# Patient Record
Sex: Male | Born: 1990 | Race: White | Hispanic: No | Marital: Married | Smoking: Never smoker
Health system: Southern US, Community
[De-identification: ages and names within clinical notes are randomized; demographics above are authoritative.]

## PROBLEM LIST (undated history)

## (undated) DIAGNOSIS — I10 Essential (primary) hypertension: Secondary | ICD-10-CM

## (undated) DIAGNOSIS — E079 Disorder of thyroid, unspecified: Secondary | ICD-10-CM

## (undated) HISTORY — DX: Disorder of thyroid, unspecified: E07.9

## (undated) HISTORY — DX: Essential (primary) hypertension: I10

## (undated) HISTORY — PX: CYST REMOVAL HAND: SHX6279

---

## 2014-04-05 ENCOUNTER — Ambulatory Visit: Payer: Self-pay

## 2014-04-05 LAB — DOT URINE DIP
BLOOD: NEGATIVE
Glucose,UR: NEGATIVE mg/dL (ref 0–75)
Specific Gravity: 1.01 (ref 1.003–1.030)

## 2014-07-19 ENCOUNTER — Ambulatory Visit: Payer: Self-pay | Admitting: Physician Assistant

## 2014-07-19 LAB — TSH: Thyroid Stimulating Horm: 3.2 u[IU]/mL

## 2014-07-19 LAB — CBC WITH DIFFERENTIAL/PLATELET
BASOS PCT: 0.6 %
Basophil #: 0 10*3/uL (ref 0.0–0.1)
EOS ABS: 0.1 10*3/uL (ref 0.0–0.7)
EOS PCT: 1.9 %
HCT: 44.5 % (ref 40.0–52.0)
HGB: 14.6 g/dL (ref 13.0–18.0)
Lymphocyte #: 2 10*3/uL (ref 1.0–3.6)
Lymphocyte %: 27 %
MCH: 29.1 pg (ref 26.0–34.0)
MCHC: 32.9 g/dL (ref 32.0–36.0)
MCV: 89 fL (ref 80–100)
MONOS PCT: 7 %
Monocyte #: 0.5 x10 3/mm (ref 0.2–1.0)
NEUTROS ABS: 4.6 10*3/uL (ref 1.4–6.5)
Neutrophil %: 63.5 %
Platelet: 229 10*3/uL (ref 150–440)
RBC: 5.03 10*6/uL (ref 4.40–5.90)
RDW: 12.7 % (ref 11.5–14.5)
WBC: 7.3 10*3/uL (ref 3.8–10.6)

## 2014-09-16 ENCOUNTER — Ambulatory Visit: Payer: Self-pay | Admitting: Physician Assistant

## 2014-10-08 ENCOUNTER — Ambulatory Visit: Payer: Self-pay | Admitting: Family Medicine

## 2016-04-06 DIAGNOSIS — G44229 Chronic tension-type headache, not intractable: Secondary | ICD-10-CM | POA: Insufficient documentation

## 2016-04-06 DIAGNOSIS — E7800 Pure hypercholesterolemia, unspecified: Secondary | ICD-10-CM | POA: Insufficient documentation

## 2017-10-07 DIAGNOSIS — E559 Vitamin D deficiency, unspecified: Secondary | ICD-10-CM | POA: Insufficient documentation

## 2018-10-27 ENCOUNTER — Ambulatory Visit (INDEPENDENT_AMBULATORY_CARE_PROVIDER_SITE_OTHER): Payer: Managed Care, Other (non HMO)

## 2018-10-27 ENCOUNTER — Ambulatory Visit: Payer: Managed Care, Other (non HMO) | Admitting: Physician Assistant

## 2018-10-27 ENCOUNTER — Encounter: Payer: Self-pay | Admitting: Physician Assistant

## 2018-10-27 VITALS — BP 122/82 | HR 65 | Temp 98.3°F | Ht 65.0 in | Wt 230.5 lb

## 2018-10-27 DIAGNOSIS — Z8639 Personal history of other endocrine, nutritional and metabolic disease: Secondary | ICD-10-CM

## 2018-10-27 DIAGNOSIS — I10 Essential (primary) hypertension: Secondary | ICD-10-CM | POA: Diagnosis not present

## 2018-10-27 DIAGNOSIS — Z131 Encounter for screening for diabetes mellitus: Secondary | ICD-10-CM

## 2018-10-27 DIAGNOSIS — Z130 Encounter for screening for diseases of the blood and blood-forming organs and certain disorders involving the immune mechanism: Secondary | ICD-10-CM

## 2018-10-27 DIAGNOSIS — F40243 Fear of flying: Secondary | ICD-10-CM

## 2018-10-27 DIAGNOSIS — Z7689 Persons encountering health services in other specified circumstances: Secondary | ICD-10-CM | POA: Diagnosis not present

## 2018-10-27 DIAGNOSIS — M25511 Pain in right shoulder: Secondary | ICD-10-CM | POA: Insufficient documentation

## 2018-10-27 DIAGNOSIS — M7521 Bicipital tendinitis, right shoulder: Secondary | ICD-10-CM

## 2018-10-27 DIAGNOSIS — Z13220 Encounter for screening for lipoid disorders: Secondary | ICD-10-CM

## 2018-10-27 MED ORDER — ALPRAZOLAM 0.5 MG PO TABS: 0.2500 mg | ORAL_TABLET | Freq: Once | ORAL | 0 refills | Status: DC | PRN

## 2018-10-27 MED ORDER — MELOXICAM 15 MG PO TABS: ORAL_TABLET | ORAL | 3 refills | Status: DC

## 2018-10-27 NOTE — Progress Notes (Signed)
HPI:                                                                Richard Long is a 27 y.o. male who presents to [facility]: Primary Care Sports Medicine today to establish care  Current concerns:   HTN: used to take Metoprolol, but self-discontinued this due to running out of refills. Wife reports BP at home is always in range, SBP 120's, DBP <80. 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 He recently lost 40 pounds this year. Denies vision change, headache, chest pain with exertion, orthopnea, lightheadedness, syncope and edema. Risk factors include:   Requesting medication for upcoming flight to the [place] this Friday. 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  R shoulder pain - acute on chronic for 2 weeks. 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 About 2 weeks ago while at work he "subluxed a guy" and reports landing on his R elbow, forcing his right shoulder up. States he now has constant pain that wakes him from sleep and difficulty with "lateral" movements. States he is "slower" drawing his gun and has pain with drawing. He has been taking Ibuprofen and Tylenol with minimal relief.   Depression screen PHQ 2/9 10/27/2018  Decreased Interest 0  Down, Depressed, Hopeless 0  PHQ - 2 Score 0    GAD 7 : Generalized Anxiety Score 10/27/2018  Nervous, Anxious, on Edge 2  Control/stop worrying 0  Worry too much - different things 2  Trouble relaxing 2  Restless 3  Easily annoyed or irritable 1  Afraid - awful might happen 3  Total GAD 7 Score 13  Anxiety Difficulty Not difficult at all      Past Medical History:  Diagnosis Date  . Hypertension   . Thyroid disease    Past Surgical History:  Procedure Laterality Date  . CYST REMOVAL HAND     Social History   Tobacco Use  . Smoking status: Never Smoker  . Smokeless tobacco: Never Used  Substance Use Topics  . Alcohol use: Yes   Comment: social   family history includes Diabetes in his father, maternal grandfather, and maternal grandmother; Heart attack in his maternal grandfather and paternal grandfather; Heart disease in his paternal grandfather; Hyperlipidemia in his mother.    ROS: Review of Systems  Gastrointestinal: Positive for constipation and diarrhea.  Psychiatric/Behavioral: Negative for depression. The patient is nervous/anxious.      Medications: Current Outpatient Medications  Medication Sig Dispense Refill  . Multiple Vitamin (MULTIVITAMIN) tablet Take 1 tablet by mouth daily.    Marland Kitchen ALPRAZolam (XANAX) 0.5 MG tablet Take 0.5-2 tablets (0.25-1 mg total) by mouth once as needed for up to 1 dose for anxiety. Take 1 hour prior to flight 10 tablet 0  . meloxicam (MOBIC) 15 MG tablet One tab PO qAM with breakfast for 2 weeks, then daily prn pain. 30 tablet 3   No current facility-administered medications for this visit.    Allergies  Allergen Reactions  . Shellfish Allergy Itching  . Tape Rash    Burn       Objective:  BP 122/82   Pulse 65   Temp 98.3  F (36.8 C) (Oral)   Ht 5\' 5"  (1.651 m)   Wt 230 lb 8 oz (104.6 kg)   BMI 38.36 kg/m  Gen:  alert, not ill-appearing, no distress, appropriate for age, obese male HEENT: head normocephalic without obvious abnormality, conjunctiva and cornea clear, trachea midline Pulm: Normal work of breathing, normal phonation, clear to auscultation bilaterally, no wheezes, rales or rhonchi CV: Normal rate, regular rhythm, s1 and s2 distinct, no murmurs, clicks or rubs  Neuro: alert and oriented x 3, no tremor MSK: extremities atraumatic, normal gait and station Skin: intact, no rashes on exposed skin, no jaundice, no cyanosis Psych: well-groomed, cooperative, good eye contact, euthymic mood, affect mood-congruent, speech is articulate, and thought processes clear and goal-directed    No results found for this or any previous visit (from the past 72  hour(s)). No results found.    Assessment and Plan: 27 y.o. male with   .Fraser Dinreston was seen today for establish care.  Diagnoses and all orders for this visit:  Encounter to establish care  History of thyroid disorder -     Thyroid Panel With TSH  Hypertension goal BP (blood pressure) < 130/80 -     COMPLETE METABOLIC PANEL WITH GFR -     Thyroid Panel With TSH  Screening for blood disease -     COMPLETE METABOLIC PANEL WITH GFR -     CBC  Screening for lipid disorders -     Lipid Panel w/reflex Direct LDL  Fear of flying -     ALPRAZolam (XANAX) 0.5 MG tablet; Take 0.5-2 tablets (0.25-1 mg total) by mouth once as needed for up to 1 dose for anxiety. Take 1 hour prior to flight  Screening for diabetes mellitus -     Hemoglobin A1c  Biceps tendinitis, right -     DG Shoulder Right; Future -     meloxicam (MOBIC) 15 MG tablet; One tab PO qAM with breakfast for 2 weeks, then daily prn pain. -     Ambulatory referral to Physical Therapy  Class 2 severe obesity due to excess calories with serious comorbidity in adult, unspecified BMI (HCC)   - Personally reviewed PMH, PSH, PFH, medications, allergies, HM - Age-appropriate cancer screening: n/a - Influenza declined - Tdap UTD - PHQ2 negative  Hypertension: BP in range in office today. He has lost 40 pounds in 1 year. Continue to manage with therapeutic lifestyle changes. Fasting labs pending  R shoulder pain: due to nature of patient's occupation and safety concerns, Sports Medicine was consulted today for R shoulder pain (see A&P note)   Patient education and anticipatory guidance given Patient agrees with treatment plan Follow-up in 6 months for HTN or sooner as needed if symptoms worsen or fail to improve  Levonne Hubertharley E. Wylodean Shimmel PA-C

## 2018-10-27 NOTE — Assessment & Plan Note (Signed)
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 Return to see me in 4 weeks, we will discuss Nitropatch versus biceps tendon sheath injection if no better.

## 2018-10-27 NOTE — Patient Instructions (Addendum)
For your blood pressure: - Goal <130/80 (Ideally 120's/70's) - take your blood pressure medication in the morning (unless instructed differently) - monitor and log blood pressures at home - check around the same time each day in a relaxed setting - Limit salt to <2500 mg/day - Follow DASH (Dietary Approach to Stopping Hypertension) eating plan - Try to get at least 150 minutes of aerobic exercise per week - Aim to go on a brisk walk 30 minutes per day at least 5 days per week. If you're not active, gradually increase how long you walk by 5 minutes each week - limit alcohol: 2 standard drinks per day for men and 1 per day for women - avoid tobacco/nicotine products. Consider smoking cessation if you smoke - weight loss: 7% of current body weight can reduce your blood pressure by 5-10 points - follow-up at least every 6 months for your blood pressure. Follow-up sooner if your BP is not controlled   

## 2018-10-27 NOTE — Progress Notes (Addendum)
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  Afterwards he had pain anteriorly, worse with flexion, abduction.  Occasional mechanical symptoms, moderate, persistent, localized without radiation.  I reviewed the past medical history, family history, social history, surgical history, and allergies today and no changes were needed.  Please see the problem list section below in epic for further details.  Past Medical History: Past Medical History:  Diagnosis Date  . Hypertension   . Thyroid disease    Past Surgical History: Past Surgical History:  Procedure Laterality Date  . CYST REMOVAL HAND     Social History: Social History   Socioeconomic History  . Marital status: Married    Spouse name: Not on file  . Number of children: Not on file  . Years of education: Not on file  . Highest education level: Not on file  Occupational History  . Occupation: Emergency planning/management officer  Social Needs  . Financial resource strain: Not on file  . Food insecurity:    Worry: Not on file    Inability: Not on file  . Transportation needs:    Medical: Not on file    Non-medical: Not on file  Tobacco Use  . Smoking status: Never Smoker  . Smokeless tobacco: Never Used  Substance and Sexual Activity  . Alcohol use: Yes    Comment: social  . Drug use: Never  . Sexual activity: Yes    Birth control/protection: None  Lifestyle  . Physical activity:    Days per week: Not on file    Minutes per session: Not on file  . Stress: Not on file  Relationships  . Social connections:    Talks on phone: Not on file    Gets together: Not on file    Attends religious service: Not on file    Active member of club or organization: Not on file    Attends meetings of clubs or organizations: Not on file    Relationship  status: Not on file  Other Topics Concern  . Not on file  Social History Narrative  . Not on file   Family History: Family History  Problem Relation Age of Onset  . Hyperlipidemia Mother   . Diabetes Father   . Diabetes Maternal Grandmother   . Heart attack Maternal Grandfather   . Diabetes Maternal Grandfather   . Heart attack Paternal Grandfather   . Heart disease Paternal Grandfather    Allergies: Allergies  Allergen Reactions  . Shellfish Allergy Itching  . Tape Rash    Burn   Medications: See med rec.  Review of Systems: No fevers, chills, night sweats, weight loss, chest pain, or shortness of breath.   Objective:    General: Well Developed, well nourished, and in no acute distress.  Neuro: Alert and oriented x3, extra-ocular muscles intact, sensation grossly intact.  HEENT: Normocephalic, atraumatic, pupils equal round reactive to light, neck supple, no masses, no lymphadenopathy, thyroid nonpalpable.  Skin: Warm and dry, no rashes. Cardiac: Regular rate and rhythm, no murmurs rubs or gallops, no lower extremity edema.  Respiratory: Clear to auscultation bilaterally. Not using accessory muscles, speaking in full sentences. Right shoulder: Inspection reveals no abnormalities, atrophy or asymmetry. Palpation is normal with no tenderness over AC joint or bicipital groove. ROM is full in all planes.  Rotator cuff strength normal throughout. No signs of impingement with negative Neer and Hawkin's tests, empty can. Positive speeds test, negative Yergason test. No labral pathology noted with negative Obrien's, negative crank, negative clunk, and good stability. Normal scapular function observed. No painful arc and no drop arm sign. 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 Return to see me in 4 weeks, we will discuss Nitropatch versus biceps tendon sheath  injection if no better. ___________________________________________ Ihor Austinhomas J. Benjamin Stainhekkekandam, M.D., ABFM., CAQSM. Primary Care and Sports Medicine [facility]  Adjunct Professor of Family Medicine  [employer]

## 2018-10-28 ENCOUNTER — Encounter: Payer: Self-pay | Admitting: *Deleted

## 2018-11-17 ENCOUNTER — Other Ambulatory Visit: Payer: Self-pay

## 2018-11-17 ENCOUNTER — Encounter: Payer: Self-pay | Admitting: Physical Therapy

## 2018-11-17 ENCOUNTER — Ambulatory Visit: Payer: Managed Care, Other (non HMO) | Admitting: Physical Therapy

## 2018-11-17 DIAGNOSIS — R293 Abnormal posture: Secondary | ICD-10-CM | POA: Diagnosis not present

## 2018-11-17 DIAGNOSIS — M25521 Pain in right elbow: Secondary | ICD-10-CM | POA: Diagnosis not present

## 2018-11-17 DIAGNOSIS — M25511 Pain in right shoulder: Secondary | ICD-10-CM | POA: Diagnosis not present

## 2018-11-17 DIAGNOSIS — R29898 Other symptoms and signs involving the musculoskeletal system: Secondary | ICD-10-CM | POA: Diagnosis not present

## 2018-11-17 NOTE — Patient Instructions (Signed)
Access Code: [email or web address]  URL: [email or web address]  Date: 11/17/2018  Prepared by: Moshe CiproStephanie Rayansh Herbst   Exercises  Doorway Pec Stretch at 90 Degrees Abduction - 3 reps - 1 sets - 30 sec hold - 1x daily - 7x weekly  Scapular Retraction with Resistance - 10 reps - 1 sets - 5 sec hold - 1x daily - 7x weekly  Prone W Scapular Retraction - 10 reps - 1 sets - 5sec hold - 1x daily - 7x weekly

## 2018-11-17 NOTE — Therapy (Signed)
[facility] [address] [place], [place], [address] Phone: [phone]   Fax:  [phone]  Physical Therapy Evaluation  Patient Details  Name: Richard Long MRN: [id number] Date of Birth: 22-Sep-1991 Referring Provider (PT): Monica Becton, MD   Encounter Date: 11/17/2018  PT End of Session - 11/17/18 1029    Visit Number  1    Number of Visits  12    Date for PT Re-Evaluation  12/29/18    PT Start Time  0931    PT Stop Time  1005    PT Time Calculation (min)  34 min    Activity Tolerance  Patient tolerated treatment well    Behavior During Therapy  [facility] for tasks assessed/performed       Past Medical History:  Diagnosis Date  . Hypertension   . Thyroid disease     Past Surgical History:  Procedure Laterality Date  . CYST REMOVAL HAND      There were no vitals filed for this visit.   Subjective Assessment - 11/17/18 0933    Subjective  Pt is a 27 y/o male who presnts to OPPT for Rt shoulder and elbow pain radiating into Rt hand.  Pt repots initial injury occured ~ 2 months ago.  Pt c/o tingling in forearm, and pain with sleeping on Rt side.      Pertinent History  HTN    Patient Stated Goals  improve shoulder pain and be able to return to regular work activity    Currently in Pain?  Yes    Pain Score  0-No pain   up to 5/10   Pain Location  Shoulder    Pain Orientation  Right    Pain Descriptors / Indicators  Pins and needles;Tingling;Aching;Sharp;Shooting;Numbness    Pain Type  Acute pain    Pain Onset  More than a month ago    Pain Frequency  Intermittent    Aggravating Factors   push ups    Pain Relieving Factors  rest         [facility] PT Assessment - 11/17/18 0938      Assessment   Medical Diagnosis  M75.21 (ICD-10-CM) - Biceps tendinitis, right    Referring Provider (PT)  Monica Becton, MD    Onset Date/Surgical Date  --   2 months ago   Hand Dominance  Right    Next MD Visit  11/27/18    Prior Therapy  ACL 6 years ago (no surgical)      Precautions   Precautions  None      Restrictions   Weight Bearing Restrictions  No      Balance Screen   Has the patient fallen in the past 6 months  No    Has the patient had a decrease in activity level because of a fear of falling?   No    Is the patient reluctant to leave their home because of a fear of falling?   No      Home Environment   Additional Comments  some discomfort with end range horizontal adduction      Prior Function   Level of Independence  Independent    Vocation  Full time employment    Vocation Requirements  2nd shift; [employer]    Leisure  jiu jitsu, build computers, video games      Cognition   Overall Cognitive Status  Within Functional Limits for tasks assessed  Posture/Postural Control   Posture/Postural Control  Postural limitations    Postural Limitations  Rounded Shoulders;Forward head      ROM / Strength   AROM / PROM / Strength  AROM;Strength      Strength   Strength Assessment Site  Shoulder;Elbow;Forearm;Hand    Right/Left Shoulder  Right;Left    Right Shoulder Flexion  4/5   with pain   Right Shoulder ABduction  3+/5   with pain   Right Shoulder Internal Rotation  5/5    Right Shoulder External Rotation  5/5    Left Shoulder Flexion  5/5    Left Shoulder ABduction  5/5    Left Shoulder Internal Rotation  5/5    Left Shoulder External Rotation  5/5    Right/Left Elbow  Right;Left    Right Elbow Flexion  5/5    Right Elbow Extension  5/5    Left Elbow Flexion  5/5    Left Elbow Extension  5/5    Right/Left Forearm  Right;Left    Right Forearm Pronation  5/5    Right Forearm Supination  5/5    Left Forearm Pronation  5/5    Left Forearm Supination  5/5    Right/Left hand  Right;Left    Right Hand Grip (lbs)  94.67   98, 105, 81   Left Hand Grip (lbs)  99   106, 100, 91     Special Tests    Special Tests  Rotator Cuff  Impingement    Rotator Cuff Impingment tests  Leanord AsalHawkins- Kennedy test;Empty Can test;Full Can test      Hawkins-Kennedy test   Findings  Positive    Side  Right      Empty Can test   Findings  Negative      Full Can test   Findings  Negative                Objective measurements completed on examination: See above findings.      [facility] Treatment/Exercise - 11/17/18 0938      Exercises   Exercises  Shoulder      Shoulder Exercises: Prone   Retraction Limitations  "W"; instructed in how to perform at home      Shoulder Exercises: Standing   Row  Both;10 reps;Theraband    Theraband Level (Shoulder Row)  Level 4 (Blue)    Row Limitations  5 sec hold      Shoulder Exercises: Stretch   Other Shoulder Stretches  doorway stretch 2x30 sec             PT Education - 11/17/18 1029    Education Details  HEP    Person(s) Educated  Patient    Methods  Explanation;Demonstration;Handout    Comprehension  Verbalized understanding;Returned demonstration;Need further instruction          PT Long Term Goals - 11/17/18 1032      PT LONG TERM GOAL #1   Title  independent with HEP    Status  New    Target Date  12/29/18      PT LONG TERM GOAL #2   Title  report 75% improvement in tingling/numbness for improved function    Status  New    Target Date  12/29/18      PT LONG TERM GOAL #3   Title  report pain < 3/10 with work activities for improved function and work activities    Status  New    Target  Date  12/29/18      PT LONG TERM GOAL #4   Title  improve Rt grip strength to at least 100# for improved strength and function    Status  New    Target Date  12/29/18             Plan - 11/17/18 1029    Clinical Impression Statement  Pt is a 27 y/o male who presents to OPPT for Rt shoulder and elbow pain due to 2 episodes of direct impact to elbow.  Pt with some signs of RTC impingement, and demonstrates decreased stregth in Rt shoulder and grip,  as well as new reports of numbness and tingling.  Pt will benefit from PT to address deficits listed.  Recommend he discuss with MD new onset of tingling as this is new since his initial MD appt.      History and Personal Factors relevant to plan of care:  HTN    Clinical Presentation  Evolving    Clinical Presentation due to:  new onset of tingling/numbness    Clinical Decision Making  Moderate    Rehab Potential  Good    PT Frequency  2x / week    PT Duration  6 weeks    PT Treatment/Interventions  ADLs/Self Care Home Management;Cryotherapy;Ultrasound;Moist Heat;Iontophoresis 4mg /ml Dexamethasone;Electrical Stimulation;Neuromuscular re-education;Therapeutic exercise;Therapeutic activities;Patient/family education;Manual techniques;Dry needling;Passive range of motion;Vasopneumatic Device;Taping    PT Next Visit Plan  review HEP, manual/modalities/DN, continue posture exercises, may benefit from neural tension exercises (try LAD Rt shoulder)    PT Home Exercise Plan  Access Code: [email or web address]    Consulted and Agree with Plan of Care  Patient       Patient will benefit from skilled therapeutic intervention in order to improve the following deficits and impairments:  Increased muscle spasms, Impaired sensation, Pain, Postural dysfunction, Impaired UE functional use, Increased fascial restricitons, Decreased strength  Visit Diagnosis: Acute pain of right shoulder - Plan: PT plan of care cert/re-cert  Abnormal posture - Plan: PT plan of care cert/re-cert  Pain in right elbow - Plan: PT plan of care cert/re-cert  Other symptoms and signs involving the musculoskeletal system - Plan: PT plan of care cert/re-cert     Problem List Patient Active Problem List   Diagnosis Date Noted  . History of thyroid disorder 10/27/2018  . Hypertension goal BP (blood pressure) < 130/80 10/27/2018  . Biceps tendinitis, right 10/27/2018  . Class 2 severe obesity due to excess calories with serious comorbidity  in adult (HCC) 10/27/2018  . 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 Vitamin D deficiency 10/07/2017  . Chronic tension-type headache, not intractable 04/06/2016  . Pure hypercholesterolemia 04/06/2016      Clarita Crane, PT, DPT 11/17/18 10:40 AM      [facility] [address] [place], [place], [address] Phone: [phone]   Fax:  [phone]  Name: Richard Long MRN: [id number] Date of Birth: 1991-08-15

## 2018-11-21 ENCOUNTER — Encounter: Payer: Managed Care, Other (non HMO) | Admitting: Physical Therapy

## 2018-11-21 ENCOUNTER — Telehealth: Payer: Self-pay | Admitting: Physical Therapy

## 2018-11-21 NOTE — Telephone Encounter (Signed)
Called pt due to no show, reports he was called into work and apologized for not calling.  Reminded of next appt and to call if unable to make it.  Clarita CraneStephanie F Charly Hunton, PT, DPT 11/21/18 9:56 AM

## 2018-11-27 ENCOUNTER — Encounter: Payer: Self-pay | Admitting: Physical Therapy

## 2018-11-27 ENCOUNTER — Encounter: Payer: Self-pay | Admitting: Sports Medicine

## 2018-11-27 ENCOUNTER — Ambulatory Visit: Payer: Managed Care, Other (non HMO) | Admitting: Physical Therapy

## 2018-11-27 ENCOUNTER — Ambulatory Visit: Payer: Managed Care, Other (non HMO) | Admitting: Sports Medicine

## 2018-11-27 DIAGNOSIS — M25511 Pain in right shoulder: Secondary | ICD-10-CM

## 2018-11-27 DIAGNOSIS — M25521 Pain in right elbow: Secondary | ICD-10-CM | POA: Diagnosis not present

## 2018-11-27 DIAGNOSIS — R29898 Other symptoms and signs involving the musculoskeletal system: Secondary | ICD-10-CM

## 2018-11-27 DIAGNOSIS — M5412 Radiculopathy, cervical region: Secondary | ICD-10-CM | POA: Insufficient documentation

## 2018-11-27 DIAGNOSIS — R293 Abnormal posture: Secondary | ICD-10-CM | POA: Diagnosis not present

## 2018-11-27 DIAGNOSIS — M7521 Bicipital tendinitis, right shoulder: Secondary | ICD-10-CM | POA: Diagnosis not present

## 2018-11-27 MED ORDER — PREDNISONE 50 MG PO TABS: ORAL_TABLET | ORAL | 0 refills | Status: DC

## 2018-11-27 NOTE — Therapy (Signed)
[facility] [address] [place], [place], [address] Phone: [phone]   Fax:  [phone]  Physical Therapy Treatment  Patient Details  Name: Richard Long MRN: [id number] Date of Birth: 1991/02/20 Referring Provider (PT): Monica Bectonhekkekandam, Thomas J, MD   Encounter Date: 11/27/2018  PT End of Session - 11/27/18 0920    Visit Number  2    Number of Visits  12    Date for PT Re-Evaluation  12/29/18    PT Start Time  0845    PT Stop Time  0920   pt had to leave early for MD appt   PT Time Calculation (min)  35 min    Activity Tolerance  Patient tolerated treatment well    Behavior During Therapy  [facility] for tasks assessed/performed       Past Medical History:  Diagnosis Date  . Hypertension   . Thyroid disease     Past Surgical History:  Procedure Laterality Date  . CYST REMOVAL HAND      There were no vitals filed for this visit.  Subjective Assessment - 11/27/18 0846    Subjective  "It's just going numb, it doesn't hurt." denies changes in weakness    Patient Stated Goals  improve shoulder pain and be able to return to regular work activity    Currently in Pain?  No/denies                       [facility] Treatment/Exercise - 11/27/18 0847      Shoulder Exercises: Supine   Horizontal ABduction  Both;15 reps;Theraband    Theraband Level (Shoulder Horizontal ABduction)  Level 3 (Green)      Shoulder Exercises: Prone   Retraction  Both;10 reps    Retraction Limitations  "W" with 5 sec hold      Shoulder Exercises: Standing   Horizontal ABduction  Both;15 reps;Theraband    Theraband Level (Shoulder Horizontal ABduction)  Level 3 (Green)    External Rotation  Both;15 reps;Theraband    Theraband Level (Shoulder External Rotation)  Level 3 (Green)    Row  Both;10 reps;Theraband    Theraband Level (Shoulder Row)  Level 4 (Blue)    Row Limitations  5 sec hold      Shoulder  Exercises: ROM/Strengthening   UBE (Upper Arm Bike)  L5 x 4 min (2' each direction)      Shoulder Exercises: Stretch   Other Shoulder Stretches  doorway stretch 3x30 sec      Manual Therapy   Manual Therapy  Joint mobilization;Soft tissue mobilization    Joint Mobilization  Rt shoulder LAD in neutral and 45 deg abdct; A/P and inf grades 2-3    Soft tissue mobilization  Rt pecs and ant/middle deltoid                  PT Long Term Goals - 11/17/18 1032      PT LONG TERM GOAL #1   Title  independent with HEP    Status  New    Target Date  12/29/18      PT LONG TERM GOAL #2   Title  report 75% improvement in tingling/numbness for improved function    Status  New    Target Date  12/29/18      PT LONG TERM GOAL #3   Title  report pain < 3/10 with work activities for improved function and work activities  Status  New    Target Date  12/29/18      PT LONG TERM GOAL #4   Title  improve Rt grip strength to at least 100# for improved strength and function    Status  New    Target Date  12/29/18            Plan - 11/27/18 0921    Clinical Impression Statement  Pt reports no pain but increased numbness and tingling in RUE.  Pt reports decrease in tingling with manual therapy especially LAD, with tingling returning when sitting with poor posture.  Tingling resolved with proper posture and shoulders back.  Slowly progressing with PT with improved symptoms.    Rehab Potential  Good    PT Frequency  2x / week    PT Duration  6 weeks    PT Treatment/Interventions  ADLs/Self Care Home Management;Cryotherapy;Ultrasound;Moist Heat;Iontophoresis 4mg /ml Dexamethasone;Electrical Stimulation;Neuromuscular re-education;Therapeutic exercise;Therapeutic activities;Patient/family education;Manual techniques;Dry needling;Passive range of motion;Vasopneumatic Device;Taping    PT Next Visit Plan  review HEP, manual/modalities/DN, continue posture exercises, may benefit from neural  tension exercises (try LAD Rt shoulder)    PT Home Exercise Plan  Access Code: [email or web address]    Consulted and Agree with Plan of Care  Patient       Patient will benefit from skilled therapeutic intervention in order to improve the following deficits and impairments:  Increased muscle spasms, Impaired sensation, Pain, Postural dysfunction, Impaired UE functional use, Increased fascial restricitons, Decreased strength  Visit Diagnosis: Acute pain of right shoulder  Abnormal posture  Pain in right elbow  Other symptoms and signs involving the musculoskeletal system     Problem List Patient Active Problem List   Diagnosis Date Noted  . History of thyroid disorder 10/27/2018  . Hypertension goal BP (blood pressure) < 130/80 10/27/2018  . Biceps tendinitis, right 10/27/2018  . Class 2 severe obesity due to excess calories with serious comorbidity in adult (HCC) 10/27/2018  . 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 Vitamin D deficiency 10/07/2017  . Chronic tension-type headache, not intractable 04/06/2016  . Pure hypercholesterolemia 04/06/2016      Clarita CraneStephanie F Avira Tillison, PT, DPT 11/27/18 9:23 AM    [facility] [address] [place], [place], [address] Phone: [phone]   Fax:  [phone]  Name: Richard Long MRN: [id number] Date of Birth: 08-16-1991

## 2018-11-27 NOTE — Assessment & Plan Note (Signed)
Right C7 distribution radiculitis. Prednisone, conservative measures started. Return to see me in 1 month if needed.

## 2018-11-27 NOTE — Progress Notes (Signed)
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  We have been treating him for right biceps tendinitis, this has improved.  Unfortunately he has developed numbness and tingling going down his right shoulder, posterior forearm down to the second and third fingers.  Times are moderate, persistent, no progressive weakness, no trauma, no constitutional symptoms.  I reviewed the past medical history, family history, social history, surgical history, and allergies today and no changes were needed.  Please see the problem list section below in epic for further details.  Past Medical History: Past Medical History:  Diagnosis Date  . Hypertension   . Thyroid disease    Past Surgical History: Past Surgical History:  Procedure Laterality Date  . CYST REMOVAL HAND     Social History: Social History   Socioeconomic History  . Marital status: Married    Spouse name: Not on file  . Number of children: Not on file  . Years of education: Not on file  . Highest education level: Not on file  Occupational History  . Occupation: Emergency planning/management officerolice Officer  Social Needs  . Financial resource strain: Not on file  . Food insecurity:    Worry: Not on file    Inability: Not on file  . Transportation needs:    Medical: Not on file    Non-medical: Not on file  Tobacco Use  . Smoking status: Never Smoker  . Smokeless tobacco: Never Used  Substance and Sexual Activity  . Alcohol use: Yes    Comment: social  . Drug use: Never  . Sexual activity: Yes    Birth control/protection: None  Lifestyle  . Physical activity:    Days per week: Not on file    Minutes per session: Not on file  . Stress: Not on file  Relationships  . Social connections:    Talks on phone: Not on file    Gets together: Not on file    Attends religious service: Not on file    Active  member of club or organization: Not on file    Attends meetings of clubs or organizations: Not on file    Relationship status: Not on file  Other Topics Concern  . Not on file  Social History Narrative  . Not on file   Family History: Family History  Problem Relation Age of Onset  . Hyperlipidemia Mother   . Diabetes Father   . Diabetes Maternal Grandmother   . Heart attack Maternal Grandfather   . Diabetes Maternal Grandfather   . Heart attack Paternal Grandfather   . Heart disease Paternal Grandfather    Allergies: Allergies  Allergen Reactions  . Shellfish Allergy Itching  . Tape Rash    Burn   Medications: See med rec.  Review of Systems: No headache, visual changes, nausea, vomiting, diarrhea, constipation, dizziness, abdominal pain, skin rash, fevers, chills, night sweats, weight loss, swollen lymph nodes, body aches, joint swelling, muscle aches, chest pain, shortness of breath, mood changes, visual or auditory hallucinations.   Objective:   General: Well Developed, well nourished, and in no acute distress.  Neuro:  Extra-ocular muscles intact, able to move all 4 extremities, sensation grossly intact.  Deep tendon reflexes tested were normal. Psych: Alert and oriented, mood congruent with affect. ENT:  Ears and nose appear unremarkable.  Hearing grossly normal. Neck: Unremarkable overall  appearance, trachea midline.  No visible thyroid enlargement. Eyes: Conjunctivae and lids appear unremarkable.  Pupils equal and round. Skin: Warm and dry, no rashes noted.  Cardiovascular: Pulses palpable, no extremity edema. Neck: Negative spurling's Full neck range of motion Grip strength and sensation normal in bilateral hands Strength good C4 to T1 distribution No sensory change to C4 to T1 Reflexes normal  Impression and Recommendations:   This case required medical decision making of moderate complexity.  Radiculitis of right cervical region Right C7 distribution  radiculitis. Prednisone, conservative measures started. Return to see me in 1 month if needed.  Biceps tendinitis, right Overall feels better with therapy. ___________________________________________ Ihor Austinhomas J. Benjamin Stainhekkekandam, M.D., ABFM., CAQSM. Primary Care and Sports Medicine [facility]  Adjunct Professor of Family Medicine  [employer]

## 2018-11-27 NOTE — Assessment & Plan Note (Signed)
Overall feels better with therapy.

## 2018-12-08 ENCOUNTER — Ambulatory Visit: Payer: Managed Care, Other (non HMO) | Admitting: Physical Therapy

## 2018-12-08 ENCOUNTER — Encounter: Payer: Self-pay | Admitting: Physical Therapy

## 2018-12-08 DIAGNOSIS — R29898 Other symptoms and signs involving the musculoskeletal system: Secondary | ICD-10-CM

## 2018-12-08 DIAGNOSIS — M25521 Pain in right elbow: Secondary | ICD-10-CM

## 2018-12-08 DIAGNOSIS — M25511 Pain in right shoulder: Secondary | ICD-10-CM

## 2018-12-08 DIAGNOSIS — R293 Abnormal posture: Secondary | ICD-10-CM

## 2018-12-08 NOTE — Therapy (Signed)
[place] [facility] [place], [place], [address] Phone: [phone]   Fax:  [phone]  Physical Therapy Treatment  Patient Details  Name: Richard Long MRN: [id number] Date of Birth: 1991/03/03 Referring Provider (PT): Silverio Decamp, MD   Encounter Date: 12/08/2018  PT End of Session - 12/08/18 0833    Visit Number  3    Number of Visits  12    Date for PT Re-Evaluation  12/29/18    PT Start Time  0800    PT Stop Time  0844    PT Time Calculation (min)  44 min    Activity Tolerance  Patient tolerated treatment well    Behavior During Therapy  [facility] for tasks assessed/performed       Past Medical History:  Diagnosis Date  . Hypertension   . Thyroid disease     Past Surgical History:  Procedure Laterality Date  . CYST REMOVAL HAND      There were no vitals filed for this visit.  Subjective Assessment - 12/08/18 0759    Subjective  was given prednisone last MD appt, then missed follow up.  reports still having numbness, but better.    Patient Stated Goals  improve shoulder pain and be able to return to regular work activity    Currently in Pain?  No/denies                       [facility] Adult PT Treatment/Exercise - 12/08/18 0801      Shoulder Exercises: Standing   Row  Both;10 reps;Theraband    Theraband Level (Shoulder Row)  Level 4 (Blue)    Row Limitations  5 sec hold      Shoulder Exercises: ROM/Strengthening   UBE (Upper Arm Bike)  L5 x 4 min (2' each direction)      Shoulder Exercises: Stretch   Other Shoulder Stretches  doorway stretch 3x30 sec      Modalities   Modalities  Traction      Traction   Type of Traction  Cervical    Min (lbs)  10    Max (lbs)  18    Hold Time  60    Rest Time  20    Time  15      Manual Therapy   Manual Therapy  Soft tissue mobilization    Manual therapy comments  skilled palpation and monitoring of soft tissue during DN     Soft tissue mobilization  Rt upper traps, levator scapula, and cervical paraspinals       Trigger Point Dry Needling - 12/08/18 2263    Consent Given?  Yes    Education Handout Provided  Yes    Muscles Treated Upper Body  Upper trapezius    Upper Trapezius Response  Twitch reponse elicited;Palpable increased muscle length           PT Education - 12/08/18 0833    Education Details  DN    Person(s) Educated  Patient    Methods  Explanation;Handout    Comprehension  Verbalized understanding          PT Long Term Goals - 11/17/18 1032      PT LONG TERM GOAL #1   Title  independent with HEP    Status  New    Target Date  12/29/18      PT LONG TERM GOAL #2   Title  report 75% improvement in tingling/numbness  for improved function    Status  New    Target Date  12/29/18      PT LONG TERM GOAL #3   Title  report pain < 3/10 with work activities for improved function and work activities    Status  New    Target Date  12/29/18      PT LONG TERM GOAL #4   Title  improve Rt grip strength to at least 100# for improved strength and function    Status  New    Target Date  12/29/18            Plan - 12/08/18 0834    Clinical Impression Statement  Pt reports numbness slightly improved but sill present.  Pt with some reproduction of numbness with pressure on active trigger points on upper trap, so addressed with dry needling and manual therapy today, but only able to tolerate 2 needle sticks today.  Traction trialed as well for possible cervical radiculopathy.  No goals met but progressing well.    Rehab Potential  Good    PT Frequency  2x / week    PT Duration  6 weeks    PT Treatment/Interventions  ADLs/Self Care Home Management;Cryotherapy;Ultrasound;Moist Heat;Iontophoresis 62m/ml Dexamethasone;Electrical Stimulation;Neuromuscular re-education;Therapeutic exercise;Therapeutic activities;Patient/family education;Manual techniques;Dry needling;Passive range of  motion;Vasopneumatic Device;Taping    PT Next Visit Plan  assess response to DN and traction, continue posture exercises, manual/modalities/DN PRN    PT Home Exercise Plan  Access Code: [email or web address]   Consulted and Agree with Plan of Care  Patient       Patient will benefit from skilled therapeutic intervention in order to improve the following deficits and impairments:  Increased muscle spasms, Impaired sensation, Pain, Postural dysfunction, Impaired UE functional use, Increased fascial restricitons, Decreased strength  Visit Diagnosis: Acute pain of right shoulder  Abnormal posture  Pain in right elbow  Other symptoms and signs involving the musculoskeletal system     Problem List Patient Active Problem List   Diagnosis Date Noted  . Radiculitis of right cervical region 11/27/2018  . History of thyroid disorder 10/27/2018  . Hypertension goal BP (blood pressure) < 130/80 10/27/2018  . Biceps tendinitis, right 10/27/2018  . Class 2 severe obesity due to excess calories with serious comorbidity in adult ([facility] 10/27/2018  . 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 Vitamin D deficiency 10/07/2017  . Chronic tension-type headache, not intractable 04/06/2016  . Pure hypercholesterolemia 04/06/2016      SLaureen Abrahams PT, DPT 12/08/18 8:37 AM     [facility] [address]Phone: [phone]  Fax:  [phone] Name: Richard KorenMRN: [id number] of Birth: 101-19-1992

## 2018-12-08 NOTE — Patient Instructions (Signed)
Access Code: [email or web address]  URL: [email or web address]  Date: 12/08/2018  Prepared by: Moshe Cipro   Exercises  Doorway Pec Stretch at 90 Degrees Abduction - 3 reps - 1 sets - 30 sec hold - 1x daily - 7x weekly  Scapular Retraction with Resistance - 10 reps - 1 sets - 5 sec hold - 1x daily - 7x weekly  Prone W Scapular Retraction - 10 reps - 1 sets - 5sec hold - 1x daily - 7x weekly  Patient Education  Trigger Point Dry Needling

## 2018-12-11 ENCOUNTER — Encounter: Payer: Self-pay | Admitting: Physical Therapy

## 2018-12-11 ENCOUNTER — Ambulatory Visit: Payer: Managed Care, Other (non HMO) | Admitting: Physical Therapy

## 2018-12-11 DIAGNOSIS — R29898 Other symptoms and signs involving the musculoskeletal system: Secondary | ICD-10-CM

## 2018-12-11 DIAGNOSIS — M25521 Pain in right elbow: Secondary | ICD-10-CM

## 2018-12-11 DIAGNOSIS — M25511 Pain in right shoulder: Secondary | ICD-10-CM

## 2018-12-11 DIAGNOSIS — R293 Abnormal posture: Secondary | ICD-10-CM

## 2018-12-11 NOTE — Therapy (Signed)
[facility] [place] [address] [place], [place], [address] Phone: [phone]   Fax:  [phone]  Physical Therapy Treatment  Patient Details  Name: Richard Long MRN: [id number] Date of Birth: 04-06-1991 Referring Provider (PT): Richard Becton, MD   Encounter Date: 12/11/2018  PT End of Session - 12/11/18 1010    Visit Number  4    Number of Visits  12    Date for PT Re-Evaluation  12/29/18    PT Start Time  0932    PT Stop Time  1020    PT Time Calculation (min)  48 min    Activity Tolerance  Patient tolerated treatment well    Behavior During Therapy  Richard Long for tasks assessed/performed       Past Medical History:  Diagnosis Date  . Hypertension   . Thyroid disease     Past Surgical History:  Procedure Laterality Date  . CYST REMOVAL HAND      There were no vitals filed for this visit.  Subjective Assessment - 12/11/18 0934    Subjective  no pain in arm, but still going numb but less frequent than it has.    Patient Stated Goals  improve shoulder pain and be able to return to regular work activity    Currently in Pain?  No/denies                       [facility] PT Treatment/Exercise - 12/11/18 0936      Shoulder Exercises: Standing   Horizontal ABduction  Both;15 reps;Theraband    Theraband Level (Shoulder Horizontal ABduction)  Level 2 (Red)    Horizontal ABduction Limitations  standing around noodle    External Rotation  Both;15 reps;Theraband    Theraband Level (Shoulder External Rotation)  Level 2 (Red)    External Rotation Limitations  standing against noodle    Diagonals  Both;15 reps;Theraband    Theraband Level (Shoulder Diagonals)  Level 2 (Red)      Shoulder Exercises: ROM/Strengthening   UBE (Upper Arm Bike)  L5 x 4 min (2' each direction)      Shoulder Exercises: Stretch   Other Shoulder Stretches  doorway stretch 3x30 sec      Traction   Type of Traction   Cervical    Min (lbs)  15    Max (lbs)  20    Hold Time  60    Rest Time  20    Time  15      Manual Therapy   Manual Therapy  Soft tissue mobilization    Manual therapy comments  skilled palpation and monitoring of soft tissue during DN    Soft tissue mobilization  Rt upper traps, levator scapula, and cervical paraspinals       Trigger Point Dry Needling - 12/11/18 1010    Consent Given?  Yes    Education Handout Provided  Yes    Muscles Treated Upper Body  Upper trapezius    Upper Trapezius Response  Twitch reponse elicited;Palpable increased muscle length                PT Long Term Goals - 12/11/18 1011      PT LONG TERM GOAL #1   Title  independent with HEP    Status  On-going    Target Date  12/29/18      PT LONG TERM GOAL #2   Title  report 75% improvement in  tingling/numbness for improved function    Baseline  1/9: improved    Status  On-going    Target Date  12/29/18      PT LONG TERM GOAL #3   Title  report pain < 3/10 with work activities for improved function and work activities    Status  Achieved      PT LONG TERM GOAL #4   Title  improve Rt grip strength to at least 100# for improved strength and function    Status  On-going    Target Date  12/29/18            Plan - 12/11/18 1012    Clinical Impression Statement  Pt reports numbness episodes has decreased and is occurring less frequently, as well as pain resolved meeting one LTGs.  Pt overall progressing well with PT and will continue to benefit from PT to maximize function.  Positive response last visit to DN and traction, and repeated again today.  Still with limited tolerance to DN.    Rehab Potential  Good    PT Frequency  2x / week    PT Duration  6 weeks    PT Treatment/Interventions  ADLs/Self Care Home Management;Cryotherapy;Ultrasound;Moist Heat;Iontophoresis 4mg /ml Dexamethasone;Electrical Stimulation;Neuromuscular re-education;Therapeutic exercise;Therapeutic  activities;Patient/family education;Manual techniques;Dry needling;Passive range of motion;Vasopneumatic Device;Taping    PT Next Visit Plan  continue posture exercises, manual/modalities/DN PRN, traction PRN    PT Home Exercise Plan  Access Code: [email or web address]    Consulted and Agree with Plan of Care  Patient       Patient will benefit from skilled therapeutic intervention in order to improve the following deficits and impairments:  Increased muscle spasms, Impaired sensation, Pain, Postural dysfunction, Impaired UE functional use, Increased fascial restricitons, Decreased strength  Visit Diagnosis: Acute pain of right shoulder  Abnormal posture  Pain in right elbow  Other symptoms and signs involving the musculoskeletal system     Problem List Patient Active Problem List   Diagnosis Date Noted  . Radiculitis of right cervical region 11/27/2018  . History of thyroid disorder 10/27/2018  . Hypertension goal BP (blood pressure) < 130/80 10/27/2018  . Biceps tendinitis, right 10/27/2018  . Class 2 severe obesity due to excess calories with serious comorbidity in adult (HCC) 10/27/2018  . 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 Vitamin D deficiency 10/07/2017  . Chronic tension-type headache, not intractable 04/06/2016  . Pure hypercholesterolemia 04/06/2016      Richard Long, PT, DPT 12/11/18 10:15 AM    [facility] [address] [place] [address] [place], [place], [address] Phone: [phone]   Fax:  [phone]  Name: Richard Long MRN: [id number] Date of Birth: [birth year]

## 2018-12-15 ENCOUNTER — Ambulatory Visit: Payer: Managed Care, Other (non HMO) | Admitting: Physical Therapy

## 2018-12-15 ENCOUNTER — Encounter: Payer: Self-pay | Admitting: Physical Therapy

## 2018-12-15 DIAGNOSIS — M25521 Pain in right elbow: Secondary | ICD-10-CM

## 2018-12-15 DIAGNOSIS — R29898 Other symptoms and signs involving the musculoskeletal system: Secondary | ICD-10-CM

## 2018-12-15 DIAGNOSIS — M25511 Pain in right shoulder: Secondary | ICD-10-CM

## 2018-12-15 DIAGNOSIS — R293 Abnormal posture: Secondary | ICD-10-CM

## 2018-12-15 NOTE — Therapy (Addendum)
Richard Long, [place], [address] Phone: [phone]   Fax:  [phone]  Physical Therapy Treatment/Discharge  Patient Details  Name: Richard Long MRN: [id number] Date of Birth: [birth year] Referring Provider (PT): Richard Decamp, MD   Encounter Date: 12/15/2018  PT End of Session - 12/15/18 1440    Visit Number  5    Number of Visits  12    Date for PT Re-Evaluation  12/29/18    PT Start Time  1400    PT Stop Time  1433    PT Time Calculation (min)  33 min    Activity Tolerance  Patient tolerated treatment well    Behavior During Therapy  Richard Long for tasks assessed/performed       Past Medical History:  Diagnosis Date  . Hypertension   . Thyroid disease     Past Surgical History:  Procedure Laterality Date  . CYST REMOVAL HAND      There were no vitals filed for this visit.  Subjective Assessment - 12/15/18 1401    Subjective  Rt arm is going numb about 1x/day.      Patient Stated Goals  improve shoulder pain and be able to return to regular work activity    Currently in Pain?  No/denies         Richard Long PT Assessment - 12/15/18 1415      Assessment   Medical Diagnosis  M75.21 (ICD-10-CM) - Biceps tendinitis, right    Referring Provider (PT)  Richard Decamp, MD    Hand Dominance  Right      Strength   Right Hand Grip (lbs)  108.33   111, 110, 104   Left Hand Grip (lbs)  108   105, 110, 109                  Richard Long - 12/15/18 1403      Shoulder Exercises: Standing   External Rotation  Right;15 reps;Theraband    Theraband Level (Shoulder External Rotation)  Level 4 (Blue)    Internal Rotation  Right;15 reps;Theraband    Theraband Level (Shoulder Internal Rotation)  Level 4 (Blue)    Extension  Both;15 reps;Theraband    Theraband Level (Shoulder Extension)  Level 4 (Blue)    Extension Limitations  5 sec hold    Row   Both;15 reps;Theraband    Theraband Level (Shoulder Row)  Level 4 (Blue)    Row Limitations  5 sec hold      Shoulder Exercises: ROM/Strengthening   UBE (Upper Arm Bike)  L5 x 4 min (2' each direction)      Shoulder Exercises: Stretch   Other Shoulder Stretches  doorway stretch 3x30 sec    Other Shoulder Stretches  supine on noodle chest stretch x3 min      Manual Therapy   Manual Therapy  Soft tissue mobilization;Manual Traction    Soft tissue mobilization  Rt upper traps and cervical paraspinals    Manual Traction  cervical traction 10 x 10 sec holds between Richard Long                  PT Long Term Goals - 12/15/18 1440      PT LONG TERM GOAL #1   Title  independent with HEP    Status  On-going    Target Date  12/29/18      PT LONG TERM GOAL #2   Title  report  75% improvement in tingling/numbness for improved function    Baseline  1/9: improved    Status  On-going    Target Date  12/29/18      PT LONG TERM GOAL #3   Title  report pain < 3/10 with work activities for improved function and work activities    Status  Achieved      PT [place] #4   Title  improve Rt grip strength to at least 100# for improved strength and function    Status  Achieved            Plan - 12/15/18 1440    Clinical Impression Statement  Pt has met LTG #4 with improved grip strength equal to Lt.  Pt reports improvement in numbness occurring only 1x/day at this time, but still with episodes of numbness.  Deferred DN and traction today to see how pt responds without intervention.  Will continue to benefit from PT to maximize function.    Rehab Potential  Good    PT Frequency  2x / week    PT Duration  6 weeks    PT Treatment/Interventions  ADLs/Self Care Home Management;Cryotherapy;Ultrasound;Moist Heat;Iontophoresis 9m/ml Dexamethasone;Electrical Stimulation;Neuromuscular re-education;Therapeutic exercise;Therapeutic activities;Patient/family education;Manual techniques;Dry  needling;Passive range of motion;Vasopneumatic Device;Taping    PT Next Visit Plan  continue posture exercises, manual/modalities/DN PRN, traction PRN    PT Home Exercise Plan  Access Code: [email or web address]   Consulted and Agree with Plan of Care  Patient       Patient will benefit from skilled therapeutic intervention in order to improve the following deficits and impairments:  Increased muscle spasms, Impaired sensation, Pain, Postural dysfunction, Impaired UE functional use, Increased fascial restricitons, Decreased strength  Visit Diagnosis: Acute pain of right shoulder  Abnormal posture  Pain in right elbow  Other symptoms and signs involving the musculoskeletal system     Problem List Patient Active Problem List   Diagnosis Date Noted  . Radiculitis of right cervical region 11/27/2018  . History of thyroid disorder 10/27/2018  . Hypertension goal BP (blood pressure) < 130/80 10/27/2018  . Biceps tendinitis, right 10/27/2018  . Class 2 severe obesity due to excess calories with serious comorbidity in adult ([place] 10/27/2018  . 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 Vitamin D deficiency 10/07/2017  . Chronic tension-type headache, not intractable 04/06/2016  . Pure hypercholesterolemia 04/06/2016      Richard Long PT, DPT 12/15/18 2:42 PM    [facility] [address] [address]Phone: [phone]  Fax:  [phone] Name: PMiner KoralMRN: [id number] of Birth: [birth year]     PHYSICAL THERAPY DISCHARGE SUMMARY  Visits from Start of Care: 5  Current functional level related to goals / functional outcomes: See above   Remaining deficits: unknown   Education / Equipment: HEP  Plan: Patient agrees to discharge.  Patient goals were not met. Patient is being discharged due to not returning since the last visit.  ?????    Richard Long PT, DPT 02/03/19 2:42  PM  [facility] [address] [address] [phone](office) [phone](fax)

## 2018-12-23 ENCOUNTER — Encounter: Payer: Self-pay | Admitting: Physical Therapy

## 2018-12-23 ENCOUNTER — Telehealth: Payer: Self-pay | Admitting: Physical Therapy

## 2018-12-23 NOTE — Telephone Encounter (Signed)
Left message due to no show for appt today.  No additional PT appts scheduled, advised to return call.  Clarita Crane, PT, DPT 12/23/18 9:53 AM

## 2019-01-01 ENCOUNTER — Ambulatory Visit: Payer: Managed Care, Other (non HMO) | Admitting: Sports Medicine

## 2019-10-12 ENCOUNTER — Other Ambulatory Visit: Payer: Self-pay

## 2019-10-12 ENCOUNTER — Encounter: Payer: Self-pay | Admitting: Sports Medicine

## 2019-10-12 ENCOUNTER — Ambulatory Visit (INDEPENDENT_AMBULATORY_CARE_PROVIDER_SITE_OTHER): Payer: Managed Care, Other (non HMO)

## 2019-10-12 ENCOUNTER — Ambulatory Visit (INDEPENDENT_AMBULATORY_CARE_PROVIDER_SITE_OTHER): Payer: Managed Care, Other (non HMO) | Admitting: Sports Medicine

## 2019-10-12 VITALS — BP 131/85 | HR 86 | Ht 65.0 in | Wt 239.0 lb

## 2019-10-12 DIAGNOSIS — M25511 Pain in right shoulder: Secondary | ICD-10-CM

## 2019-10-12 DIAGNOSIS — Z23 Encounter for immunization: Secondary | ICD-10-CM | POA: Diagnosis not present

## 2019-10-12 MED ORDER — TRAMADOL HCL 50 MG PO TABS: 50.0000 mg | ORAL_TABLET | Freq: Three times a day (TID) | ORAL | 0 refills | Status: DC | PRN

## 2019-10-12 NOTE — Progress Notes (Signed)
Subjective:    CC: Right shoulder pain  HPI: This is a pleasant 28 year old male, last week he had to wrestle around a suspect.  He had a little bit of pain over his deltoid on the right shoulder.  Then at home he was wrestling his friend, and developed more severe pain, he is now unable to sleep, pain is over the deltoid, he has full range of motion.  He also has a bit of pain in his neck.  No progressive weakness, no radiation down past the elbow, no paresthesias into the hand or fingertips.  He has a 53-month-old and desires a flu shot today.  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  I reviewed the past medical history, family history, social history, surgical history, and allergies today and no changes were needed.  Please see the problem list section below in epic for further details.  Past Medical History: Past Medical History:  Diagnosis Date  . Hypertension   . Thyroid disease    Past Surgical History: Past Surgical History:  Procedure Laterality Date  . CYST REMOVAL HAND     Social History: Social History   Socioeconomic History  . Marital status: Married    Spouse name: Not on file  . Number of children: Not on file  . Years of education: Not on file  . Highest education level: Not on file  Occupational History  . Occupation: Engineer, structural  Social Needs  . Financial resource strain: Not on file  . Food insecurity    Worry: Not on file    Inability: Not on file  . Transportation needs    Medical: Not on file    Non-medical: Not on file  Tobacco Use  . Smoking status: Never Smoker  . Smokeless tobacco: Never Used  Substance and Sexual Activity  . Alcohol use: Yes    Comment: social  . Drug use: Never  . Sexual activity: Yes    Birth control/protection: None  Lifestyle  . Physical activity    Days per week: Not on file    Minutes per session: Not on file  . Stress: Not on file  Relationships  . Social Herbalist on  phone: Not on file    Gets together: Not on file    Attends religious service: Not on file    Active member of club or organization: Not on file    Attends meetings of clubs or organizations: Not on file    Relationship status: Not on file  Other Topics Concern  . Not on file  Social History Narrative  . Not on file   Family History: Family History  Problem Relation Age of Onset  . Hyperlipidemia Mother   . Diabetes Father   . Diabetes Maternal Grandmother   . Heart attack Maternal Grandfather   . Diabetes Maternal Grandfather   . Heart attack Paternal Grandfather   . Heart disease Paternal Grandfather    Allergies: Allergies  Allergen Reactions  . Shellfish Allergy Itching  . Tape Rash    Burn   Medications: See med rec.  Review of Systems: No fevers, chills, night sweats, weight loss, chest pain, or shortness of breath.   Objective:    General: Well Developed, well nourished, and in no acute distress.  Neuro: Alert and oriented x3, extra-ocular muscles intact, sensation grossly intact.  HEENT: Normocephalic, atraumatic, pupils equal round reactive to light, neck supple, no masses, no lymphadenopathy, thyroid  nonpalpable.  Skin: Warm and dry, no rashes. Cardiac: Regular rate and rhythm, no murmurs rubs or gallops, no lower extremity edema.  Respiratory: Clear to auscultation bilaterally. Not using accessory muscles, speaking in full sentences. Right shoulder: Inspection reveals no abnormalities, atrophy or asymmetry. Palpation is normal with no tenderness over AC joint or bicipital groove. ROM is full in all planes. Rotator cuff strength normal throughout.  Only minimal pain with resisted external rotation. Mildly positive Neer and Hawkin's tests, empty can. Speeds and Yergason's tests normal. Mildly positive Obrien's, mildly positive crank, negative clunk, and good stability. Normal scapular function observed. No painful arc and no drop arm sign. No apprehension  sign  Impression and Recommendations:    Acute pain of right shoulder Pain is likely a rotator cuff strain. Difficult to evaluate, he is quite strong. He did have some labral signs as well. Pain is waking him from sleep so today we performed a right subacromial injection, repeating x-rays, adding home rehab, tramadol for pain relief, return to see me in 4 weeks.   ___________________________________________ Ihor Austin. Benjamin Stain, M.D., ABFM., CAQSM. Primary Care and Sports Medicine [facility]  Adjunct Professor of Family Medicine  [employer]

## 2019-10-12 NOTE — Assessment & Plan Note (Signed)
Pain is likely a rotator cuff strain. Difficult to evaluate, he is quite strong. He did have some labral signs as well. Pain is waking him from sleep so today we performed a right subacromial injection, repeating x-rays, adding home rehab, tramadol for pain relief, return to see me in 4 weeks.

## 2019-10-15 ENCOUNTER — Telehealth: Payer: Self-pay | Admitting: *Deleted

## 2019-10-15 NOTE — Telephone Encounter (Signed)
Pt called today and needs a formal letter for his employer stating his specific injury, work restrictions, if any, and length of time needed for those restrictions.  This is going to be a worker's comp case but he doesn't have any of that information or forms yet.

## 2019-10-16 NOTE — Telephone Encounter (Signed)
Letter has been completed

## 2019-10-16 NOTE — Telephone Encounter (Signed)
Letter printed, patient coming to get it

## 2019-10-20 DIAGNOSIS — S46911A Strain of unspecified muscle, fascia and tendon at shoulder and upper arm level, right arm, initial encounter: Secondary | ICD-10-CM | POA: Insufficient documentation

## 2019-10-28 ENCOUNTER — Telehealth: Payer: Self-pay

## 2019-10-28 DIAGNOSIS — M25511 Pain in right shoulder: Secondary | ICD-10-CM

## 2019-10-28 MED ORDER — TRAMADOL HCL 50 MG PO TABS: 50.0000 mg | ORAL_TABLET | Freq: Three times a day (TID) | ORAL | 0 refills | Status: DC | PRN

## 2019-10-28 NOTE — Telephone Encounter (Signed)
Patient called stating that due to workers comp, he is having to change providers for his injury. Patient has MRI set up for Monday and will get scheduled with his new provider through workers comp after that.   Patient knows that Dr T probably cannot refill Tramadol since he is technically no longer under his care, but wanted to know what Dr T could recommend OTC to handle pain. Patient states he is taking Tylenol around the clock but it is not touching the pain.   Any suggestions?

## 2019-10-28 NOTE — Telephone Encounter (Signed)
Happy to do another refill. Sending this in now.

## 2019-10-28 NOTE — Telephone Encounter (Signed)
Thank you :)

## 2019-11-09 ENCOUNTER — Ambulatory Visit: Payer: Managed Care, Other (non HMO) | Admitting: Sports Medicine

## 2020-02-10 ENCOUNTER — Other Ambulatory Visit: Payer: Self-pay

## 2020-02-10 ENCOUNTER — Encounter: Payer: Self-pay | Admitting: Family Medicine

## 2020-02-10 ENCOUNTER — Ambulatory Visit (INDEPENDENT_AMBULATORY_CARE_PROVIDER_SITE_OTHER): Payer: Managed Care, Other (non HMO) | Admitting: Family Medicine

## 2020-02-10 VITALS — BP 135/94 | HR 101 | Temp 98.1°F | Ht 65.0 in | Wt 245.0 lb

## 2020-02-10 DIAGNOSIS — R0683 Snoring: Secondary | ICD-10-CM | POA: Diagnosis not present

## 2020-02-10 DIAGNOSIS — R6882 Decreased libido: Secondary | ICD-10-CM | POA: Diagnosis not present

## 2020-02-10 DIAGNOSIS — G4730 Sleep apnea, unspecified: Secondary | ICD-10-CM

## 2020-02-10 DIAGNOSIS — G4733 Obstructive sleep apnea (adult) (pediatric): Secondary | ICD-10-CM | POA: Insufficient documentation

## 2020-02-10 DIAGNOSIS — M25511 Pain in right shoulder: Secondary | ICD-10-CM

## 2020-02-10 DIAGNOSIS — Z8639 Personal history of other endocrine, nutritional and metabolic disease: Secondary | ICD-10-CM | POA: Diagnosis not present

## 2020-02-10 NOTE — Assessment & Plan Note (Signed)
History of suppressed TSH with normal T4 levels previously.  Mild heterogeneity noted on previous US.  Update TSH and free t4 today.

## 2020-02-10 NOTE — Assessment & Plan Note (Signed)
Recent surgical repair.  He will continue rehab with PT.

## 2020-02-10 NOTE — Assessment & Plan Note (Signed)
Referral for home sleep study.

## 2020-02-10 NOTE — Assessment & Plan Note (Signed)
Check testosterone levels today.  Could also be related to thyroid dysfunction. If testosterone and thyroid levels return abnormal we may need to look into pituitary issue as the cause.

## 2020-02-10 NOTE — Patient Instructions (Signed)
Great to meet you today! Please have labs completed today.  We'll be in touch with results.  I have entered orders for a sleep study.  You should be contacted to arrange this.    Hope the shoulder recover goes well!

## 2020-02-10 NOTE — Progress Notes (Signed)
Richard Long - 29 y.o. male MRN [id number]  Date of birth: 1991/07/09  Subjective No chief complaint on file.   HPI Richard Long is a 29 y.o. male with history of HTN and HLD here today to discuss sleep issues and changes to libido.  He did recently have R shoulder surgery for labral and bicep tendon tear.  He is seeing PT for rehab on this.   He reports that his wife has noted that he snores very heavily.  He states that she has also seen him stop breathing while sleeping.  He does have some mild fatigue throughout the day.  He denies napping or drowsiness while driving.   His other issue is problems with libido/sex drive.  He reports he has had diminished libido for a couple of months.  Denies depression or anxiety symptoms at this time.  He does occasionally have issues with maintaining erection but for the most part doesn't have any issues with this.    ROS:  A comprehensive ROS was completed and negative except as noted per HPI  Allergies  Allergen Reactions  . Shellfish Allergy Itching  . Tape Rash    Burn    Past Medical History:  Diagnosis Date  . Hypertension   . Thyroid disease     Past Surgical History:  Procedure Laterality Date  . CYST REMOVAL HAND      Social History   Socioeconomic History  . Marital status: Married    Spouse name: Not on file  . Number of children: Not on file  . Years of education: Not on file  . Highest education level: Not on file  Occupational History  . Occupation: Engineer, structural  Tobacco Use  . Smoking status: Never Smoker  . Smokeless tobacco: Never Used  Substance and Sexual Activity  . Alcohol use: Yes    Comment: social  . Drug use: Never  . Sexual activity: Yes    Birth control/protection: None  Other Topics Concern  . Not on file  Social History Narrative  . Not on file   Social Determinants of Health   Financial Resource Strain:   . Difficulty of Paying Living Expenses: Not on file  Food  Insecurity:   . Worried About Charity fundraiser in the Last Year: Not on file  . Ran Out of Food in the Last Year: Not on file  Transportation Needs:   . Lack of Transportation (Medical): Not on file  . Lack of Transportation (Non-Medical): Not on file  Physical Activity:   . Days of Exercise per Week: Not on file  . Minutes of Exercise per Session: Not on file  Stress:   . Feeling of Stress : Not on file  Social Connections:   . Frequency of Communication with Friends and Family: Not on file  . Frequency of Social Gatherings with Friends and Family: Not on file  . Attends Religious Services: Not on file  . Active Member of Clubs or Organizations: Not on file  . Attends Archivist Meetings: Not on file  . Marital Status: Not on file    Family History  Problem Relation Age of Onset  . Hyperlipidemia Mother   . Diabetes Father   . Diabetes Maternal Grandmother   . Heart attack Maternal Grandfather   . Diabetes Maternal Grandfather   . Heart attack Paternal Grandfather   . Heart disease Paternal Grandfather     Health Maintenance  Topic Date Due  . HIV  Screening  10/25/2005  . TETANUS/TDAP  02/09/2028  . INFLUENZA VACCINE  Completed     ----------------------------------------------------------------------------------------------------------------------------------------------------------------------------------------------------------------- Physical Exam BP (!) 135/94   Pulse (!) 101   Temp 98.1 F (36.7 C) (Oral)   Ht 5\' 5"  (1.651 m)   Wt 245 lb (111.1 kg)   BMI 40.77 kg/m   Physical Exam Constitutional:      Appearance: Normal appearance.  HENT:     Head: Normocephalic and atraumatic.  Eyes:     General: No scleral icterus. Cardiovascular:     Rate and Rhythm: Normal rate and regular rhythm.  Pulmonary:     Effort: Pulmonary effort is normal.     Breath sounds: Normal breath sounds.  Musculoskeletal:     Cervical back: Neck supple.  Skin:     General: Skin is warm and dry.  Neurological:     General: No focal deficit present.     Mental Status: He is alert.  Psychiatric:        Mood and Affect: Mood normal.        Behavior: Behavior normal.     ------------------------------------------------------------------------------------------------------------------------------------------------------------------------------------------------------------------- Assessment and Plan  History of thyroid disorder History of suppressed TSH with normal T4 levels previously.  Mild heterogeneity noted on previous .  Update TSH and free t4 today.   Observed sleep apnea Referral for home sleep study.    Decreased libido Check testosterone levels today.  Could also be related to thyroid dysfunction. If testosterone and thyroid levels return abnormal we may need to look into pituitary issue as the cause.   Acute pain of right shoulder Recent surgical repair.  He will continue rehab with PT.     No orders of the defined types were placed in this encounter.   No follow-ups on file.    This visit occurred during the SARS-CoV-2 public health emergency.  Safety protocols were in place, including screening questions prior to the visit, additional usage of staff PPE, and extensive cleaning of exam room while observing appropriate contact time as indicated for disinfecting solutions.

## 2020-02-11 ENCOUNTER — Encounter: Payer: Self-pay | Admitting: Family Medicine

## 2020-02-11 LAB — TSH+FREE T4: TSH W/REFLEX TO FT4: 2.33 mIU/L (ref 0.40–4.50)

## 2020-02-11 LAB — TESTOSTERONE: Testosterone: 338 ng/dL (ref 250–827)

## 2020-03-07 ENCOUNTER — Encounter: Payer: Self-pay | Admitting: Family Medicine

## 2020-03-07 DIAGNOSIS — E7800 Pure hypercholesterolemia, unspecified: Secondary | ICD-10-CM

## 2020-03-07 DIAGNOSIS — G4730 Sleep apnea, unspecified: Secondary | ICD-10-CM

## 2020-03-07 DIAGNOSIS — I10 Essential (primary) hypertension: Secondary | ICD-10-CM

## 2020-03-08 NOTE — Telephone Encounter (Signed)
Ok to get that started. Pretty low hanging fruit. I am sure he would be ok with this.

## 2020-03-08 NOTE — Telephone Encounter (Signed)
Richard Long,   I placed referral for anywhere [facility]. Can you work on this and update patient?   Thanks!

## 2020-03-08 NOTE — Telephone Encounter (Signed)
I have printed this and routed to [facility] since the referral did not come to the workque today and she will be working referrals the rest of the week

## 2020-03-08 NOTE — Telephone Encounter (Signed)
Would you be OK to place a referral or do you want to defer this to Dr Ashley Royalty upon his return

## 2020-03-08 NOTE — Addendum Note (Signed)
Addended by: Jed Limerick on: 03/08/2020 11:35 AM   Modules accepted: Orders

## 2020-03-09 NOTE — Telephone Encounter (Signed)
Referral was sent to [facility]

## 2020-05-05 ENCOUNTER — Other Ambulatory Visit: Payer: Self-pay

## 2020-05-05 ENCOUNTER — Ambulatory Visit (HOSPITAL_BASED_OUTPATIENT_CLINIC_OR_DEPARTMENT_OTHER): Payer: Managed Care, Other (non HMO) | Attending: Family Medicine | Admitting: Internal Medicine

## 2020-05-05 ENCOUNTER — Encounter (HOSPITAL_BASED_OUTPATIENT_CLINIC_OR_DEPARTMENT_OTHER): Payer: Managed Care, Other (non HMO) | Admitting: Internal Medicine

## 2020-05-05 VITALS — Ht 66.0 in | Wt 237.0 lb

## 2020-05-05 DIAGNOSIS — R0683 Snoring: Secondary | ICD-10-CM | POA: Insufficient documentation

## 2020-05-05 DIAGNOSIS — G4733 Obstructive sleep apnea (adult) (pediatric): Secondary | ICD-10-CM | POA: Insufficient documentation

## 2020-05-07 DIAGNOSIS — G4733 Obstructive sleep apnea (adult) (pediatric): Secondary | ICD-10-CM

## 2020-05-07 NOTE — Procedures (Signed)
   Patient Name: Richard Long, Richard Long Date: 05/05/2020 Gender: Male D.O.B: [birth year] Age (years): 28 Referring Provider: Elmarie Shiley MD Height (inches): 65 Interpreting Physician: Jetty Duhamel MD, ABSM Weight (lbs): 245 RPSGT: Celene Kras BMI: 41 MRN: [id number] Neck Size: 16.50  CLINICAL INFORMATION Sleep Study Type: HST Indication for sleep study: OSA Epworth Sleepiness Score: 2  SLEEP STUDY TECHNIQUE A multi-channel overnight portable sleep study was performed. The channels recorded were: nasal airflow, thoracic respiratory movement, and oxygen saturation with a pulse oximetry. Snoring was also monitored.  MEDICATIONS Patient self administered medications include: none reported.  SLEEP ARCHITECTURE Patient was studied for 387.1 minutes. The sleep efficiency was 100.0 % and the patient was supine for 0%. The arousal index was 0.0 per hour.  RESPIRATORY PARAMETERS The overall AHI was 19.2 per hour, with a central apnea index of 0.0 per hour. The oxygen nadir was 81% during sleep.  CARDIAC DATA Mean heart rate during sleep was 65.3 bpm.  IMPRESSIONS - Moderate obstructive sleep apnea occurred during this study (AHI = 19.2/h). - No significant central sleep apnea occurred during this study (CAI = 0.0/h). - Moderate oxygen desaturation was noted during this study (Min O2 = 81%). Mean sat 94%. - Patient snored.  DIAGNOSIS - Obstructive Sleep Apnea (327.23 [G47.33 ICD-10])  RECOMMENDATIONS - Suggest CPAP titration sleep study or autopap. Other options would be based on clinical judgment. - Be careful with alcohol, sedatives and other CNS depressants that may worsen sleep apnea and disrupt normal sleep architecture. - Sleep hygiene should be reviewed to assess factors that may improve sleep quality. - Weight management and regular exercise should be initiated or continued.  [Electronically signed] 05/07/2020 10:30 AM  Jetty Duhamel MD, ABSM Diplomate, American  Board of Sleep Medicine   NPI: [id number]                          Jetty Duhamel Diplomate, American Board of Sleep Medicine  ELECTRONICALLY SIGNED ON:  05/07/2020, 10:27 AM [facility] PH: [phone]   FX: [phone] ACCREDITED BY THE AMERICAN ACADEMY OF SLEEP MEDICINE

## 2020-05-20 ENCOUNTER — Encounter: Payer: Self-pay | Admitting: Family Medicine

## 2020-05-31 NOTE — Progress Notes (Signed)
Received separate message that he wanted to discuss weight loss medications to help with management of his OSA.    Sent message to have him schedule so that we can discuss this and CPAP.

## 2020-07-05 ENCOUNTER — Telehealth: Payer: Self-pay

## 2020-07-05 NOTE — Telephone Encounter (Signed)
Patient states he has contact [facility] Billing and was told to call the [facility] office.   He was charged for an EEG that he did not have.   Forwarding this message to our billing contact Haskell Riling. Please contact the patient with information.

## 2020-07-19 NOTE — Telephone Encounter (Signed)
Called and spoke with [place].  I am going to email the manager in billing and reach out to Pulmonary.  Unfortunately, our office doesn't do any of the billing for sleep studies.  Once I have an answer, I told Alger, I would call him back.

## 2020-07-20 NOTE — Telephone Encounter (Signed)
I called and spoke with Richard Long.  We don't do the billing for home sleep studies so I have reached out to [facility] Pulmonary to see what I can find out for [facility].  Once I have an answer, I will let him know.

## 2020-07-21 ENCOUNTER — Telehealth: Payer: Self-pay

## 2020-07-21 NOTE — Telephone Encounter (Signed)
Richard Long lvm stating he had spoken to someone a few days prior and was waiting for a callback.   VM concerned a billing error. Per chart notes, the message has been sent to Richard Long for continuity.

## 2020-08-21 ENCOUNTER — Telehealth: Payer: Managed Care, Other (non HMO) | Admitting: Physician Assistant

## 2020-08-21 DIAGNOSIS — H5712 Ocular pain, left eye: Secondary | ICD-10-CM

## 2020-08-21 MED ORDER — ERYTHROMYCIN 5 MG/GM OP OINT: TOPICAL_OINTMENT | OPHTHALMIC | 0 refills | Status: DC

## 2020-08-21 MED ORDER — OFLOXACIN 0.3 % OP SOLN: 1.0000 [drp] | Freq: Four times a day (QID) | OPHTHALMIC | 0 refills | Status: DC

## 2020-08-21 NOTE — Addendum Note (Signed)
Addended by: Haynes Bast on: 08/21/2020 09:45 AM   Modules accepted: Orders

## 2020-08-21 NOTE — Progress Notes (Signed)
E-Visit for [employer]   We are sorry that you are not feeling well.  Here is how we plan to help!  Based on what you have shared with me it looks like you have conjunctivitis caused by recent trauma to your eye. Conjunctivitis is a common inflammatory or infectious condition of the eye that is often referred to as "pink eye".  In most cases it is contagious (viral or bacterial). However, not all conjunctivitis requires antibiotics (ex. Allergic).  We have made appropriate suggestions for you based upon your presentation.  Erythromycin eye ointment has been sent in for you.  Pink eye can be highly contagious.  It is typically spread through direct contact with secretions, or contaminated objects or surfaces that one may have touched.  Strict handwashing is suggested with soap and water is urged.  If not available, use alcohol based had sanitizer.  Avoid unnecessary touching of the eye.  If you wear contact lenses, you will need to refrain from wearing them until you see no white discharge from the eye for at least 24 hours after being on medication.  You should see symptom improvement in 1-2 days after starting the medication regimen.  Call us if symptoms are not improved in 1-2 days.  Home Care:  Wash your hands often!  Do not wear your contacts until you complete your treatment plan.  Avoid sharing towels, bed linen, personal items with a person who has pink eye.  See attention for anyone in your home with similar symptoms.  Get Help Right Away If:  Your symptoms do not improve.  You develop blurred or loss of vision.  Your symptoms worsen (increased discharge, pain or redness)   Greater than 5 minutes, yet less than 10 minutes of time have been spent researching, coordinating, and implementing care for this patient today.  Jarold Motto PA-C

## 2020-12-26 ENCOUNTER — Ambulatory Visit (INDEPENDENT_AMBULATORY_CARE_PROVIDER_SITE_OTHER): Payer: Managed Care, Other (non HMO) | Admitting: Family Medicine

## 2020-12-26 ENCOUNTER — Other Ambulatory Visit: Payer: Self-pay

## 2020-12-26 ENCOUNTER — Encounter: Payer: Self-pay | Admitting: Family Medicine

## 2020-12-26 DIAGNOSIS — G4730 Sleep apnea, unspecified: Secondary | ICD-10-CM | POA: Diagnosis not present

## 2020-12-26 DIAGNOSIS — I10 Essential (primary) hypertension: Secondary | ICD-10-CM

## 2020-12-26 MED ORDER — AMLODIPINE BESYLATE 2.5 MG PO TABS: 2.5000 mg | ORAL_TABLET | Freq: Every day | ORAL | 3 refills | Status: DC

## 2020-12-26 NOTE — Patient Instructions (Addendum)
Let's try starting amlodipine 2.5mg  daily.  See me again in about 4-6 weeks.

## 2020-12-26 NOTE — Assessment & Plan Note (Signed)
Discussed calorie reduction as well as medications to assist with weight loss.  May be interested in trying GLP-1 agonist and we'll discuss further at follow up visit.

## 2020-12-26 NOTE — Assessment & Plan Note (Signed)
Moderate OSA on previous study.  Prefers to work on weight loss prior to starting CPAP.

## 2020-12-26 NOTE — Assessment & Plan Note (Signed)
Start amlodipine 2.5mg  daily.  Recommend low sodium diet in addition to medication.  Stress related to job may be contributing as well and we discussed possibly adding medication for anxiety at f/u if this continues.
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
## 2021-01-07 ENCOUNTER — Emergency Department (HOSPITAL_BASED_OUTPATIENT_CLINIC_OR_DEPARTMENT_OTHER)
Admission: EM | Admit: 2021-01-07 | Discharge: 2021-01-07 | Disposition: A | Discharge status: Home / Self Care | Payer: Worker's Compensation | Attending: Emergency Medicine | Admitting: Emergency Medicine

## 2021-01-07 ENCOUNTER — Other Ambulatory Visit: Payer: Self-pay

## 2021-01-07 ENCOUNTER — Emergency Department (HOSPITAL_BASED_OUTPATIENT_CLINIC_OR_DEPARTMENT_OTHER): Payer: Worker's Compensation

## 2021-01-07 ENCOUNTER — Encounter (HOSPITAL_BASED_OUTPATIENT_CLINIC_OR_DEPARTMENT_OTHER): Payer: Self-pay | Admitting: Emergency Medicine

## 2021-01-07 DIAGNOSIS — Z79899 Other long term (current) drug therapy: Secondary | ICD-10-CM | POA: Insufficient documentation

## 2021-01-07 DIAGNOSIS — I10 Essential (primary) hypertension: Secondary | ICD-10-CM | POA: Diagnosis not present

## 2021-01-07 DIAGNOSIS — Y990 Civilian activity done for income or pay: Secondary | ICD-10-CM | POA: Insufficient documentation

## 2021-01-07 DIAGNOSIS — S8392XA Sprain of unspecified site of left knee, initial encounter: Secondary | ICD-10-CM | POA: Insufficient documentation

## 2021-01-07 DIAGNOSIS — R040 Epistaxis: Secondary | ICD-10-CM | POA: Diagnosis not present

## 2021-01-07 DIAGNOSIS — S59902A Unspecified injury of left elbow, initial encounter: Secondary | ICD-10-CM | POA: Diagnosis not present

## 2021-01-07 DIAGNOSIS — S8992XA Unspecified injury of left lower leg, initial encounter: Secondary | ICD-10-CM | POA: Diagnosis present

## 2021-01-07 DIAGNOSIS — S86912A Strain of unspecified muscle(s) and tendon(s) at lower leg level, left leg, initial encounter: Secondary | ICD-10-CM

## 2021-01-07 MED ORDER — IBUPROFEN 400 MG PO TABS
600.0000 mg | ORAL_TABLET | Freq: Once | ORAL | Status: AC
  Administered 2021-01-07: 600 mg via ORAL
  Filled 2021-01-07: qty 1
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
## 2021-01-07 NOTE — Discharge Instructions (Signed)
Bear weight as tolerated.  Take Tylenol Motrin as needed for pain control.  Recommend rest, ice and elevation for today.  Recommend following up with a sports medicine or orthopedic specialist this coming week if you continue to have pain.  If your pain worsens and you are unable to bear weight, return to ER for reassessment.

## 2021-01-07 NOTE — ED Notes (Signed)
Patient transported to X-ray 
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
## 2021-01-18 ENCOUNTER — Telehealth: Payer: Self-pay | Admitting: Family Medicine
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
## 2021-01-30 ENCOUNTER — Ambulatory Visit: Payer: Managed Care, Other (non HMO) | Admitting: Family Medicine

## 2021-01-30 ENCOUNTER — Encounter: Payer: Self-pay | Admitting: Family Medicine

## 2021-01-30 ENCOUNTER — Other Ambulatory Visit: Payer: Self-pay

## 2021-01-30 VITALS — BP 132/65 | HR 72 | Temp 97.8°F | Wt 239.9 lb

## 2021-01-30 DIAGNOSIS — E7800 Pure hypercholesterolemia, unspecified: Secondary | ICD-10-CM

## 2021-01-30 DIAGNOSIS — I10 Essential (primary) hypertension: Secondary | ICD-10-CM

## 2021-01-30 DIAGNOSIS — E559 Vitamin D deficiency, unspecified: Secondary | ICD-10-CM

## 2021-01-30 DIAGNOSIS — R6882 Decreased libido: Secondary | ICD-10-CM

## 2021-01-30 NOTE — Progress Notes (Signed)
Richard Long - 30 y.o. male MRN [id number]  Date of birth: June 24, 1991  Subjective Chief Complaint  Patient presents with  . Hypertension    HPI Richard Long is a 30 y.o. male here today for follow up of HTN.  Started on amlodipine 2.5mg  daily at last visit. He is tolerating this well without significant side effects.  BP at home have been well controlled.  He denies chest pain, shortness of breath, palpitations, headache or vision changes.  He did have recent work related knee injury that he is seeing workers comp for.    He would like to have updated labs today.    ROS:  A comprehensive ROS was completed and negative except as noted per HPI   Allergies  Allergen Reactions  . Shellfish Allergy Itching  . Tape Rash    Burn    Past Medical History:  Diagnosis Date  . Hypertension   . Thyroid disease     Past Surgical History:  Procedure Laterality Date  . CYST REMOVAL HAND      Social History   Socioeconomic History  . Marital status: Married    Spouse name: Not on file  . Number of children: Not on file  . Years of education: Not on file  . Highest education level: Not on file  Occupational History  . Occupation: Emergency planning/management officer  Tobacco Use  . Smoking status: Never Smoker  . Smokeless tobacco: Never Used  Vaping Use  . Vaping Use: Never used  Substance and Sexual Activity  . Alcohol use: Yes    Comment: social  . Drug use: Never  . Sexual activity: Yes    Birth control/protection: None  Other Topics Concern  . Not on file  Social History Narrative  . Not on file   Social Determinants of Health   Financial Resource Strain: Not on file  Food Insecurity: Not on file  Transportation Needs: Not on file  Physical Activity: Not on file  Stress: Not on file  Social Connections: Not on file    Family History  Problem Relation Age of Onset  . Hyperlipidemia Mother   . Diabetes Father   . Diabetes Maternal Grandmother   . Heart attack  Maternal Grandfather   . Diabetes Maternal Grandfather   . Heart attack Paternal Grandfather   . Heart disease Paternal Grandfather     Health Maintenance  Topic Date Due  . Hepatitis C Screening  Never done  . HIV Screening  Never done  . COVID-19 Vaccine (1) 02/15/2021 (Originally 10/26/1995)  . INFLUENZA VACCINE  03/02/2021 (Originally 07/03/2020)  . TETANUS/TDAP  02/09/2028     ----------------------------------------------------------------------------------------------------------------------------------------------------------------------------------------------------------------- Physical Exam BP 132/65 (BP Location: Left Arm, Patient Position: Sitting, Cuff Size: Large)   Pulse 72   Temp 97.8 F (36.6 C)   Wt 239 lb 13.9 oz (108.8 kg)   SpO2 98%   BMI 39.92 kg/m   Physical Exam Constitutional:      Appearance: Normal appearance.  HENT:     Head: Normocephalic and atraumatic.  Eyes:     General: No scleral icterus. Cardiovascular:     Rate and Rhythm: Normal rate and regular rhythm.  Pulmonary:     Effort: Pulmonary effort is normal.     Breath sounds: Normal breath sounds.  Musculoskeletal:     Cervical back: Neck supple.  Neurological:     General: No focal deficit present.     Mental Status: He is alert.  Psychiatric:  Mood and Affect: Mood normal.        Behavior: Behavior normal.     ------------------------------------------------------------------------------------------------------------------------------------------------------------------------------------------------------------------- Assessment and Plan  Hypertension goal BP (blood pressure) < 130/80 Blood pressure is at goal at for age and co-morbidities.  I recommend continuation of amlodipine at 2.5mg .  In addition they were instructed to follow a low sodium diet with regular exercise to help to maintain adequate control of blood pressure.    Pure hypercholesterolemia Update lipid  panel.    No orders of the defined types were placed in this encounter.   No follow-ups on file.    This visit occurred during the SARS-CoV-2 public health emergency.  Safety protocols were in place, including screening questions prior to the visit, additional usage of staff PPE, and extensive cleaning of exam room while observing appropriate contact time as indicated for disinfecting solutions.

## 2021-01-30 NOTE — Assessment & Plan Note (Signed)
Blood pressure is at goal at for age and co-morbidities.  I recommend continuation of amlodipine at 2.5mg .  In addition they were instructed to follow a low sodium diet with regular exercise to help to maintain adequate control of blood pressure.

## 2021-01-30 NOTE — Patient Instructions (Signed)
Check on Wegovy and Saxenda coverage through your insurance Continue on amlodipine at 2.5mg  daily.

## 2021-01-30 NOTE — Assessment & Plan Note (Signed)
Update lipid panel.  

## 2021-02-07 ENCOUNTER — Encounter: Payer: Self-pay | Admitting: Family Medicine

## 2021-02-07 LAB — CBC
HCT: 45.3 % (ref 38.5–50.0)
Hemoglobin: 15.2 g/dL (ref 13.2–17.1)
MCH: 29.1 pg (ref 27.0–33.0)
MCHC: 33.6 g/dL (ref 32.0–36.0)
MCV: 86.8 fL (ref 80.0–100.0)
MPV: 9.8 fL (ref 7.5–12.5)
Platelets: 239 10*3/uL (ref 140–400)
RBC: 5.22 10*6/uL (ref 4.20–5.80)
RDW: 12.2 % (ref 11.0–15.0)
WBC: 5.1 10*3/uL (ref 3.8–10.8)

## 2021-02-07 LAB — COMPLETE METABOLIC PANEL WITH GFR
AG Ratio: 2 (calc) (ref 1.0–2.5)
ALT: 24 U/L (ref 9–46)
AST: 16 U/L (ref 10–40)
Albumin: 4.6 g/dL (ref 3.6–5.1)
Alkaline phosphatase (APISO): 67 U/L (ref 36–130)
BUN: 12 mg/dL (ref 7–25)
CO2: 33 mmol/L — ABNORMAL HIGH (ref 20–32)
Calcium: 10.2 mg/dL (ref 8.6–10.3)
Chloride: 101 mmol/L (ref 98–110)
Creat: 1.06 mg/dL (ref 0.60–1.35)
GFR, Est African American: 109 mL/min/{1.73_m2} (ref >=60)
GFR, Est Non African American: 94 mL/min/{1.73_m2} (ref >=60)
Globulin: 2.3 g/dL (calc) (ref 1.9–3.7)
Glucose, Bld: 107 mg/dL — ABNORMAL HIGH (ref 65–99)
Potassium: 4.2 mmol/L (ref 3.5–5.3)
Sodium: 140 mmol/L (ref 135–146)
Total Bilirubin: 0.6 mg/dL (ref 0.2–1.2)
Total Protein: 6.9 g/dL (ref 6.1–8.1)

## 2021-02-07 LAB — LIPID PANEL
Cholesterol: 265 mg/dL — ABNORMAL HIGH (ref <200)
HDL: 41 mg/dL (ref >=40)
LDL Cholesterol (Calc): 189 mg/dL (calc) — ABNORMAL HIGH
Non-HDL Cholesterol (Calc): 224 mg/dL (calc) — ABNORMAL HIGH (ref <130)
Total CHOL/HDL Ratio: 6.5 (calc) — ABNORMAL HIGH (ref <5.0)
Triglycerides: 181 mg/dL — ABNORMAL HIGH (ref <150)

## 2021-02-07 LAB — TSH: TSH: 2.87 mIU/L (ref 0.40–4.50)

## 2021-02-07 LAB — VITAMIN D 25 HYDROXY (VIT D DEFICIENCY, FRACTURES): Vit D, 25-Hydroxy: 25 ng/mL — ABNORMAL LOW (ref 30–100)

## 2021-02-07 LAB — TESTOSTERONE: Testosterone: 254 ng/dL (ref 250–827)

## 2021-02-09 ENCOUNTER — Other Ambulatory Visit: Payer: Self-pay | Admitting: Family Medicine

## 2021-02-09 ENCOUNTER — Encounter: Payer: Self-pay | Admitting: Family Medicine

## 2021-02-09 DIAGNOSIS — R7989 Other specified abnormal findings of blood chemistry: Secondary | ICD-10-CM

## 2021-02-10 ENCOUNTER — Other Ambulatory Visit: Payer: Self-pay | Admitting: Family Medicine

## 2021-02-10 MED ORDER — ROSUVASTATIN CALCIUM 10 MG PO TABS: 10.0000 mg | ORAL_TABLET | Freq: Every day | ORAL | 3 refills | Status: DC

## 2021-02-14 LAB — TESTOSTERONE TOTAL,FREE,BIO, MALES
Albumin: 4.5 g/dL (ref 3.6–5.1)
Sex Hormone Binding: 21 nmol/L (ref 10–50)
Testosterone, Bioavailable: 136.9 ng/dL (ref >=110.0)
Testosterone, Free: 66.6 pg/mL (ref 46.0–224.0)
Testosterone: 364 ng/dL (ref 250–827)

## 2021-08-11 ENCOUNTER — Encounter: Payer: Self-pay | Admitting: Family Medicine

## 2021-08-11 ENCOUNTER — Emergency Department
Admission: EM | Admit: 2021-08-11 | Discharge: 2021-08-11 | Disposition: A | Discharge status: Home / Self Care | Payer: Managed Care, Other (non HMO) | Source: Home / Self Care

## 2021-08-11 ENCOUNTER — Ambulatory Visit (HOSPITAL_BASED_OUTPATIENT_CLINIC_OR_DEPARTMENT_OTHER)
Admission: RE | Admit: 2021-08-11 | Discharge: 2021-08-11 | Disposition: A | Discharge status: Home / Self Care | Payer: Managed Care, Other (non HMO) | Source: Ambulatory Visit | Attending: Family Medicine | Admitting: Family Medicine

## 2021-08-11 ENCOUNTER — Other Ambulatory Visit: Payer: Self-pay

## 2021-08-11 ENCOUNTER — Encounter: Payer: Self-pay | Admitting: Emergency Medicine

## 2021-08-11 ENCOUNTER — Ambulatory Visit: Payer: Managed Care, Other (non HMO) | Admitting: Family Medicine

## 2021-08-11 VITALS — Ht 65.0 in | Wt 236.0 lb

## 2021-08-11 DIAGNOSIS — M25562 Pain in left knee: Secondary | ICD-10-CM

## 2021-08-11 DIAGNOSIS — S86112A Strain of other muscle(s) and tendon(s) of posterior muscle group at lower leg level, left leg, initial encounter: Secondary | ICD-10-CM | POA: Insufficient documentation

## 2021-08-11 DIAGNOSIS — S83242A Other tear of medial meniscus, current injury, left knee, initial encounter: Secondary | ICD-10-CM | POA: Insufficient documentation

## 2021-08-11 NOTE — Patient Instructions (Signed)
Nice to meet you Please use ice as needed  Please continue the brace  I will call with the results from today   Please send me a message in MyChart with any questions or updates.  We will schedule a virtual visit to discuss the MRI results.   --Dr. Jordan Likes

## 2021-08-11 NOTE — ED Provider Notes (Signed)
[facility]    CSN: [id number] Arrival date & time: 08/11/21  7654      History   Chief Complaint Chief Complaint  Patient presents with   Knee Pain    left    HPI Richard Long is a 30 y.o. male.   HPI 30 year old male presents with left knee pain and currently wearing brace from prior injury.  Patient is in [employer] and reports doing martial arts last night and he felt a crunching/popping sounds in his left knee.  Patient reports left knee pain is currently 2 of 10.  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  She reports has not had surgery for this tear as of yet.  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 Authorizing Provider  amLODipine (NORVASC) 2.5 MG tablet Take 2.5 mg by mouth daily.    [provider]  rosuvastatin (CRESTOR) 10 MG tablet Take 1 tablet (10 mg total) by mouth daily. 02/10/21   [name], DO    Family History Family History  Problem Relation Age of Onset   Hyperlipidemia Mother    Diabetes Father    Diabetes Maternal Grandmother    Heart attack Maternal Grandfather    Diabetes Maternal  Grandfather    Heart attack Paternal Grandfather    Heart disease Paternal Grandfather     Social History Social History   Tobacco Use   Smoking status: Never   Smokeless tobacco: Never  Vaping Use   Vaping Use: Never used  Substance Use Topics   Alcohol use: Yes    Comment: social   Drug use: Never     Allergies   Shellfish allergy and Tape   Review of Systems Review of Systems  Musculoskeletal:        Left knee pain  All other systems reviewed and are negative.   Physical Exam Triage Vital Signs ED Triage Vitals  Enc Vitals Group     BP 08/11/21 1022 (!) 134/94     Pulse Rate 08/11/21 1022 81     Resp 08/11/21 1022 18     Temp 08/11/21 1022 99.2 F (37.3 C)     Temp Source 08/11/21 1022 Oral     SpO2 08/11/21 1022 98 %     Weight 08/11/21 1023 236 lb (107 kg)     Height 08/11/21 1023 5\' 5"  (1.651 m)     Head Circumference --      Peak Flow --      Pain Score 08/11/21 1023 2     Pain Loc --  Pain Edu? --      Excl. in GC? --    No data found.  Updated Vital Signs BP (!) 134/94 (BP Location: Right Arm)   Pulse 81   Temp 99.2 F (37.3 C) (Oral)   Resp 18   Ht 5\' 5"  (1.651 m)   Wt 236 lb (107 kg)   SpO2 98%   BMI 39.27 kg/m   Physical Exam Vitals and nursing note reviewed.  Constitutional:      General: He is not in acute distress.    Appearance: Normal appearance. He is obese. He is not ill-appearing.  HENT:     Head: Normocephalic and atraumatic.     Mouth/Throat:     Mouth: Mucous membranes are moist.     Pharynx: Oropharynx is clear.  Eyes:     Extraocular Movements: Extraocular movements intact.     Conjunctiva/sclera: Conjunctivae normal.     Pupils: Pupils are equal, round, and reactive to light.  Cardiovascular:     Rate and Rhythm: Normal rate and regular rhythm.     Pulses: Normal pulses.     Heart sounds: Normal heart sounds.  Pulmonary:     Effort: Pulmonary effort is normal.     Breath sounds: Normal breath sounds. No  wheezing, rhonchi or rales.  Musculoskeletal:        General: Signs of injury present. No deformity.     Cervical back: Normal range of motion and neck supple.     Comments: Left knee pain: Exam limited due to pain today patient wearing left knee brace  Skin:    General: Skin is warm and dry.  Neurological:     General: No focal deficit present.     Mental Status: He is alert and oriented to person, place, and time. Mental status is at baseline.  Psychiatric:        Mood and Affect: Mood normal.        Behavior: Behavior normal.        Thought Content: Thought content normal.     UC Treatments / Results  Labs (all labs ordered are listed, but only abnormal results are displayed) Labs Reviewed - No data to display  EKG   Radiology No results found.  Procedures Procedures (including critical care time)  Medications Ordered in UC Medications - No data to display  Initial Impression / Assessment and Plan / UC Course  I have reviewed the triage vital signs and the nursing notes.  Pertinent labs & imaging results that were available during my care of the patient were reviewed by me and considered in my medical decision making (see chart for details).     Acute pain of left knee-Advised/instructed patient we have made appointment with him in with [facility] orthopedic provider at 11:30 AM at [facility] location.  [facility]. [facility] information provided above.  Patient discharged home, hemodynamically stable. Final Clinical Impressions(s) / UC Diagnoses   Final diagnoses:  Acute pain of left knee     Discharge Instructions      Advised/instructed patient we have made appointment with him in with [facility] orthopedic provider at 11:30 AM at [facility] location.  Dr. [facility] information provided above.     ED Prescriptions   None    PDMP not reviewed this encounter.   Ralene Bathe, FNP 08/11/21 1132

## 2021-08-11 NOTE — Discharge Instructions (Addendum)
Advised/instructed patient we have made appointment with him in with [facility] orthopedic provider at 11:30 AM at [facility] location.  Dr. Ralene Bathe information provided above.

## 2021-08-11 NOTE — ED Triage Notes (Signed)
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

## 2021-08-11 NOTE — Progress Notes (Signed)
  Richard Long - 30 y.o. male MRN [id number]  Date of birth: 04/07/1991  SUBJECTIVE:  Including CC & ROS.  No chief complaint on file.   Richard Long is a 30 y.o. male that is presenting with acute left knee pain.  He was performing jujitsu last night and felt a pop in his knee.  Since that time he has had significant pain.  It is occurring over the posterior lateral corner of the knee.  Unable to walk without a limp.    Review of Systems See HPI   HISTORY: Past Medical, Surgical, Social, and Family History Reviewed & Updated per EMR.   Pertinent Historical Findings include:  Past Medical History:  Diagnosis Date   Hypertension    Thyroid disease     Past Surgical History:  Procedure Laterality Date   CYST REMOVAL HAND      Family History  Problem Relation Age of Onset   Hyperlipidemia Mother    Diabetes Father    Diabetes Maternal Grandmother    Heart attack Maternal Grandfather    Diabetes Maternal Grandfather    Heart attack Paternal Grandfather    Heart disease Paternal Grandfather     Social History   Socioeconomic History   Marital status: Married    Spouse name: Not on file   Number of children: Not on file   Years of education: Not on file   Highest education level: Not on file  Occupational History   Occupation: Emergency planning/management officer  Tobacco Use   Smoking status: Never   Smokeless tobacco: Never  Vaping Use   Vaping Use: Never used  Substance and Sexual Activity   Alcohol use: Yes    Comment: social   Drug use: Never   Sexual activity: Yes    Birth control/protection: None  Other Topics Concern   Not on file  Social History Narrative   Not on file   Social Determinants of Health   Financial Resource Strain: Not on file  Food Insecurity: Not on file  Transportation Needs: Not on file  Physical Activity: Not on file  Stress: Not on file  Social Connections: Not on file  Intimate Partner Violence: Not on file     PHYSICAL  EXAM:  VS: Ht 5\' 5"  (1.651 m)   Wt 236 lb (107 kg)   BMI 39.27 kg/m  Physical Exam Gen: NAD, alert, cooperative with exam, well-appearing MSK:  Left knee: Mild effusion. Limited extension and flexion. Unable to bear weight without pain. Positive dial test. Positive McMurray's test. Neurovascularly intact     ASSESSMENT & PLAN:   Acute medial meniscus tear of left knee Traumatic injury that occurred on 9/8.  This was during jujitsu.  Concerning for a meniscal root tear versus a tear of the posterior lateral corner resulting in an unstable pain. -Counseled on home exercise therapy and supportive care. -X-ray. -MRI of the left knee to evaluate for internal derangement given traumatic nature.

## 2021-08-11 NOTE — Assessment & Plan Note (Signed)
Traumatic injury that occurred on 9/8.  This was during jujitsu.  Concerning for a meniscal root tear versus a tear of the posterior lateral corner resulting in an unstable pain. -Counseled on home exercise therapy and supportive care. -X-ray. -MRI of the left knee to evaluate for internal derangement given traumatic nature.

## 2021-08-14 ENCOUNTER — Ambulatory Visit: Payer: Managed Care, Other (non HMO) | Admitting: Family Medicine

## 2021-08-15 ENCOUNTER — Telehealth: Payer: Self-pay | Admitting: Family Medicine

## 2021-08-15 NOTE — Telephone Encounter (Signed)
Left VM for patient. If he calls back please have him speak with a nurse/CMA and inform that his xrays are normal.   If any questions then please take the best time and phone number to call and I will try to call him back.   Myra Rude, MD [facility] 08/15/2021, 8:12 AM

## 2021-08-15 NOTE — Telephone Encounter (Signed)
Pt informed of below.  

## 2021-08-19 ENCOUNTER — Other Ambulatory Visit: Payer: Self-pay

## 2021-08-19 ENCOUNTER — Ambulatory Visit (INDEPENDENT_AMBULATORY_CARE_PROVIDER_SITE_OTHER): Payer: Managed Care, Other (non HMO)

## 2021-08-19 DIAGNOSIS — S83242A Other tear of medial meniscus, current injury, left knee, initial encounter: Secondary | ICD-10-CM

## 2021-08-22 ENCOUNTER — Encounter: Payer: Self-pay | Admitting: Family Medicine

## 2021-08-23 ENCOUNTER — Telehealth: Payer: Managed Care, Other (non HMO) | Admitting: Family Medicine

## 2021-08-24 ENCOUNTER — Other Ambulatory Visit: Payer: Self-pay

## 2021-08-24 ENCOUNTER — Telehealth (INDEPENDENT_AMBULATORY_CARE_PROVIDER_SITE_OTHER): Payer: Managed Care, Other (non HMO) | Admitting: Family Medicine

## 2021-08-24 DIAGNOSIS — S86112D Strain of other muscle(s) and tendon(s) of posterior muscle group at lower leg level, left leg, subsequent encounter: Secondary | ICD-10-CM | POA: Diagnosis not present

## 2021-08-24 NOTE — Assessment & Plan Note (Signed)
MRI was revealing for a soleus strain near the origin.  No ligamentous changes in the posterior lateral corner. -Counseled on home exercise therapy and supportive care. -Could consider injection or physical therapy.

## 2021-08-24 NOTE — Progress Notes (Signed)
Virtual Visit via Video Note  I connected with Richard Long on 08/24/21 at  1:10 PM EDT by a video enabled telemedicine application and verified that I am speaking with the correct person using two identifiers.  Location: Patient: home Provider: office   I discussed the limitations of evaluation and management by telemedicine and the availability of in person appointments. The patient expressed understanding and agreed to proceed.  History of Present Illness:  Mr. Richard Long is a 30 year old male that is following up after the MRI of his left knee.  This was significant for a soleus muscle strain near the fibular head.  Has a mild surface irregularity of the medial patellar facet.   Observations/Objective:   Assessment and Plan:  Strain of left soleus muscle: MRI was revealing for a soleus strain near the origin.  No ligamentous changes in the posterior lateral corner. -Counseled on home exercise therapy and supportive care. -Could consider injection or physical therapy.   Follow Up Instructions:    I discussed the assessment and treatment plan with the patient. The patient was provided an opportunity to ask questions and all were answered. The patient agreed with the plan and demonstrated an understanding of the instructions.   The patient was advised to call back or seek an in-person evaluation if the symptoms worsen or if the condition fails to improve as anticipated.    Clare Gandy, MD

## 2021-09-28 ENCOUNTER — Encounter: Payer: Self-pay | Admitting: Medical-Surgical

## 2021-09-28 ENCOUNTER — Telehealth (INDEPENDENT_AMBULATORY_CARE_PROVIDER_SITE_OTHER): Payer: Managed Care, Other (non HMO) | Admitting: Medical-Surgical

## 2021-09-28 DIAGNOSIS — A084 Viral intestinal infection, unspecified: Secondary | ICD-10-CM

## 2021-09-28 NOTE — Progress Notes (Signed)
Virtual Visit via Video Note  I connected with Richard Long on 09/28/21 at 10:10 AM EDT by a video enabled telemedicine application and verified that I am speaking with the correct person using two identifiers.   I discussed the limitations of evaluation and management by telemedicine and the availability of in person appointments. The patient expressed understanding and agreed to proceed.  Patient location: home Provider locations: office  Subjective:    CC: GI s/s  HPI: Pleasant 30 year old male presenting via MyChart video visit with reports of 4 days of GI upset including excessive bloating, indigestion, and watery diarrhea.  Has several family members who have been sick including his son and his wife.  They think it came from his grandmother originally.  He did have a fever on his second night of symptoms but did not check his temperature with a thermometer.  Notes that his fever broke in the early morning hours when he woke up covered in multiple blankets and soaked with sweat.  No nausea or vomiting and has been able to eat and drink appropriately.  Has been drinking Pedialyte to stay hydrated.  Diarrhea is watery, light brown in color.  No hematochezia or melena noted.  No antibiotic use in the last 3 months.  Has been out of work for 3 days and would like to have a work note sent via [employer].  Past medical history, Surgical history, Family history not pertinant except as noted below, Social history, Allergies, and medications have been entered into the medical record, reviewed, and corrections made.   Review of Systems: See HPI for pertinent positives and negatives.   Objective:    General: Speaking clearly in complete sentences without any shortness of breath.  Alert and oriented x3.  Normal judgment. No apparent acute distress.  Impression and Recommendations:    1. Viral gastroenteritis Symptoms consistent with viral gastroenteritis.  Recommend symptomatic treatment at  home including rest, increased fluids, and the brat diet.  Avoid FODMAP foods as these increase bloating and gassiness.  Recommend starting with small frequent meals including breads and other carbohydrates to help bulk stools.  Avoid over-the-counter antidiarrheals.  If perirectal skin is irritated, consider using A&D ointment or Desitin.  Stay well-hydrated.  Work note sent via [employer].  I discussed the assessment and treatment plan with the patient. The patient was provided an opportunity to ask questions and all were answered. The patient agreed with the plan and demonstrated an understanding of the instructions.   The patient was advised to call back or seek an in-person evaluation if the symptoms worsen or if the condition fails to improve as anticipated.  20 minutes of non-face-to-face time was provided during this encounter.  Return if symptoms worsen or fail to improve.  Richard Ohm, DNP, APRN, FNP-BC [facility]

## 2021-09-28 NOTE — Progress Notes (Signed)
4 days, bloated, indigestion, diarrhea

## 2021-12-01 ENCOUNTER — Encounter: Payer: Self-pay | Admitting: Physician Assistant

## 2021-12-01 ENCOUNTER — Telehealth (INDEPENDENT_AMBULATORY_CARE_PROVIDER_SITE_OTHER): Payer: Managed Care, Other (non HMO) | Admitting: Physician Assistant

## 2021-12-01 VITALS — Ht 65.0 in | Wt 236.0 lb

## 2021-12-01 DIAGNOSIS — J329 Chronic sinusitis, unspecified: Secondary | ICD-10-CM

## 2021-12-01 DIAGNOSIS — J40 Bronchitis, not specified as acute or chronic: Secondary | ICD-10-CM

## 2021-12-01 MED ORDER — PREDNISONE 50 MG PO TABS: 50.0000 mg | ORAL_TABLET | Freq: Every day | ORAL | 0 refills | Status: DC

## 2021-12-01 MED ORDER — AMOXICILLIN-POT CLAVULANATE 875-125 MG PO TABS: 1.0000 | ORAL_TABLET | Freq: Two times a day (BID) | ORAL | 0 refills | Status: AC

## 2021-12-01 NOTE — Progress Notes (Signed)
Symptoms started 1.5-2 weeks ago Feeling better now, but still having wet cough and drainage, ear fullness No fever for 1.5 weeks  No OTC meds  No Covid testing

## 2021-12-01 NOTE — Progress Notes (Signed)
..  Virtual Visit via Video Note  I connected with Richard Long on 12/01/21 at  9:50 AM EST by a video enabled telemedicine application and verified that I am speaking with the correct person using two identifiers.  Location: Patient: home Provider: clinic  .Marland KitchenParticipating in visit:  Patient: Richard Long Provider: Tandy Gaw PA-C   I discussed the limitations of evaluation and management by telemedicine and the availability of in person appointments. The patient expressed understanding and agreed to proceed.  History of Present Illness: Pt is a 30 yo male with HTN who presents to the clinic with 2 weeks of URI symptoms. He is feeling some better but still has productive cough, SOB and sinus drainage. His son had virus that is likely he had to. No covid testing. No OTC medications. No fever for over 1.5 weeks. He tries to exert like exercise again and then feels congested and coughing up a lot of stuff. He is blowing out yellow stuff as well.   .. 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    Assessment and Plan: Marland KitchenJoanathan was seen today for sinus problem.  Diagnoses and all orders for this visit:  Sinobronchitis -     predniSONE (DELTASONE) 50 MG tablet; Take 1 tablet (50 mg total) by mouth daily. For post viral cough -     amoxicillin-clavulanate (AUGMENTIN) 875-125 MG tablet; Take 1 tablet by mouth 2 (two) times daily for 10 days.   2.5 weeks of URI symptoms Start with prednisone and see if more post viral inflammation and then if not improving start augmentin Consider netti pot Follow up as needed or if symptoms worsen.     Follow Up Instructions:    I discussed the assessment and treatment plan with the patient. The patient was provided an opportunity to ask questions and all were answered. The patient agreed with the plan and demonstrated an understanding of the instructions.   The patient was advised to call back or seek an in-person evaluation if the symptoms worsen or if the condition fails to improve as anticipated.   Richard Din, PA-C

## 2021-12-22 ENCOUNTER — Other Ambulatory Visit: Payer: Self-pay | Admitting: Family Medicine

## 2021-12-22 NOTE — Telephone Encounter (Signed)
Patient has been scheduled for a HTN f/u on 01/16/22.

## 2021-12-22 NOTE — Telephone Encounter (Signed)
Pls contact the patient to schedule appt. Not seen since March 2022. Sending 30 day medication refill.   Thanks

## 2022-01-16 ENCOUNTER — Ambulatory Visit: Payer: Managed Care, Other (non HMO) | Admitting: Family Medicine

## 2022-01-16 ENCOUNTER — Encounter: Payer: Self-pay | Admitting: Family Medicine

## 2022-01-16 ENCOUNTER — Other Ambulatory Visit: Payer: Self-pay

## 2022-01-16 DIAGNOSIS — E7800 Pure hypercholesterolemia, unspecified: Secondary | ICD-10-CM | POA: Diagnosis not present

## 2022-01-16 DIAGNOSIS — I10 Essential (primary) hypertension: Secondary | ICD-10-CM

## 2022-01-16 MED ORDER — AMLODIPINE BESYLATE 2.5 MG PO TABS: 2.5000 mg | ORAL_TABLET | Freq: Every day | ORAL | 3 refills | Status: DC

## 2022-01-16 MED ORDER — ROSUVASTATIN CALCIUM 10 MG PO TABS: 10.0000 mg | ORAL_TABLET | Freq: Every day | ORAL | 3 refills | Status: DC

## 2022-01-16 NOTE — Assessment & Plan Note (Signed)
Blood pressure remains well controlled with current strength of amlodipine.  Recommend continuation.

## 2022-01-16 NOTE — Assessment & Plan Note (Signed)
Tolerating rosuvastatin well for management of hyperlipidemia.  Has upcoming annual exam, will recheck labs at this visit.

## 2022-01-16 NOTE — Progress Notes (Signed)
Richard Long - 31 y.o. male MRN [id number]  Date of birth: September 29, 1991  Subjective Chief Complaint  Patient presents with   Hypertension    HPI Richard Long is a 31 year old male here today for follow-up of hypertension.  He continues to do well with amlodipine for management of hypertension.  He denies any symptoms related to hypertension including chest pain, shortness of breath, palpitations, headaches or vision changes.  Tolerating Crestor well for management of hyperlipidemia.  ROS:  A comprehensive ROS was completed and negative except as noted per HPI  Allergies  Allergen Reactions   Shellfish Allergy Anaphylaxis and Itching   Shrimp Extract Allergy Skin Test    Tape Rash    Burn    Past Medical History:  Diagnosis Date   Hypertension    Thyroid disease     Past Surgical History:  Procedure Laterality Date   CYST REMOVAL HAND      Social History   Socioeconomic History   Marital status: Married    Spouse name: Not on file   Number of children: Not on file   Years of education: Not on file   Highest education level: Not on file  Occupational History   Occupation: Emergency planning/management officer  Tobacco Use   Smoking status: Never   Smokeless tobacco: Never  Vaping Use   Vaping Use: Never used  Substance and Sexual Activity   Alcohol use: Yes    Comment: social   Drug use: Never   Sexual activity: Yes    Birth control/protection: None  Other Topics Concern   Not on file  Social History Narrative   Not on file   Social Determinants of Health   Financial Resource Strain: Not on file  Food Insecurity: Not on file  Transportation Needs: Not on file  Physical Activity: Not on file  Stress: Not on file  Social Connections: Not on file    Family History  Problem Relation Age of Onset   Hyperlipidemia Mother    Diabetes Father    Diabetes Maternal Grandmother    Heart attack Maternal Grandfather    Diabetes Maternal Grandfather    Heart attack Paternal  Grandfather    Heart disease Paternal Grandfather     Health Maintenance  Topic Date Due   HIV Screening  Never done   Hepatitis C Screening  Never done   INFLUENZA VACCINE  03/02/2022 (Originally 07/03/2021)   TETANUS/TDAP  02/09/2028   HPV VACCINES  Aged Out   COVID-19 Vaccine  Discontinued     ----------------------------------------------------------------------------------------------------------------------------------------------------------------------------------------------------------------- Physical Exam BP 134/78 (BP Location: Left Arm, Patient Position: Sitting, Cuff Size: Large)    Pulse 86    Temp 98 F (36.7 C)    Ht 5\' 5"  (1.651 m)    Wt 233 lb 12.8 oz (106.1 kg)    SpO2 98%    BMI 38.91 kg/m   Physical Exam Constitutional:      Appearance: Normal appearance.  Eyes:     General: No scleral icterus. Cardiovascular:     Rate and Rhythm: Normal rate and regular rhythm.  Pulmonary:     Effort: Pulmonary effort is normal.     Breath sounds: Normal breath sounds.  Musculoskeletal:     Cervical back: Neck supple.  Neurological:     General: No focal deficit present.     Mental Status: He is alert.  Psychiatric:        Mood and Affect: Mood normal.        Behavior: Behavior  normal.    ------------------------------------------------------------------------------------------------------------------------------------------------------------------------------------------------------------------- Assessment and Plan  Hypertension goal BP (blood pressure) < 130/80 Blood pressure remains well controlled with current strength of amlodipine.  Recommend continuation.  Pure hypercholesterolemia Tolerating rosuvastatin well for management of hyperlipidemia.  Has upcoming annual exam, will recheck labs at this visit.   Meds ordered this encounter  Medications   amLODipine (NORVASC) 2.5 MG tablet    Sig: Take 1 tablet (2.5 mg total) by mouth daily.    Dispense:  90  tablet    Refill:  3    PATIENT IS REQUESTING A REFILL FOR AMLODIPINE, PLEASE SEND A NEW SCRIPT APPROPRIATE, THANK YOU   rosuvastatin (CRESTOR) 10 MG tablet    Sig: Take 1 tablet (10 mg total) by mouth daily.    Dispense:  90 tablet    Refill:  3    No follow-ups on file.    This visit occurred during the SARS-CoV-2 public health emergency.  Safety protocols were in place, including screening questions prior to the visit, additional usage of staff PPE, and extensive cleaning of exam room while observing appropriate contact time as indicated for disinfecting solutions.

## 2022-02-13 ENCOUNTER — Ambulatory Visit (INDEPENDENT_AMBULATORY_CARE_PROVIDER_SITE_OTHER): Payer: Managed Care, Other (non HMO) | Admitting: Family Medicine

## 2022-02-13 ENCOUNTER — Encounter: Payer: Self-pay | Admitting: Family Medicine

## 2022-02-13 ENCOUNTER — Other Ambulatory Visit: Payer: Self-pay

## 2022-02-13 VITALS — BP 120/77 | HR 68 | Temp 98.3°F | Ht 65.0 in | Wt 245.0 lb

## 2022-02-13 DIAGNOSIS — G4733 Obstructive sleep apnea (adult) (pediatric): Secondary | ICD-10-CM

## 2022-02-13 DIAGNOSIS — E7800 Pure hypercholesterolemia, unspecified: Secondary | ICD-10-CM | POA: Diagnosis not present

## 2022-02-13 DIAGNOSIS — I10 Essential (primary) hypertension: Secondary | ICD-10-CM

## 2022-02-13 DIAGNOSIS — Z0000 Encounter for general adult medical examination without abnormal findings: Secondary | ICD-10-CM

## 2022-02-13 DIAGNOSIS — Z8639 Personal history of other endocrine, nutritional and metabolic disease: Secondary | ICD-10-CM

## 2022-02-13 DIAGNOSIS — R5383 Other fatigue: Secondary | ICD-10-CM

## 2022-02-13 MED ORDER — CLINDAMYCIN PHOSPHATE 1 % EX GEL: Freq: Two times a day (BID) | CUTANEOUS | 0 refills | Status: DC

## 2022-02-13 NOTE — Patient Instructions (Signed)
Preventive Care 21-32 Years Old, Male ?Preventive care refers to lifestyle choices and visits with your health care provider that can promote health and wellness. Preventive care visits are also called wellness exams. ?What can I expect for my preventive care visit? ?Counseling ?During your preventive care visit, your health care provider may ask about your: ?Medical history, including: ?Past medical problems. ?Family medical history. ?Current health, including: ?Emotional well-being. ?Home life and relationship well-being. ?Sexual activity. ?Lifestyle, including: ?Alcohol, nicotine or tobacco, and drug use. ?Access to firearms. ?Diet, exercise, and sleep habits. ?Safety issues such as seatbelt and bike helmet use. ?Sunscreen use. ?Work and work environment. ?Physical exam ?Your health care provider may check your: ?Height and weight. These may be used to calculate your BMI (body mass index). BMI is a measurement that tells if you are at a healthy weight. ?Waist circumference. This measures the distance around your waistline. This measurement also tells if you are at a healthy weight and may help predict your risk of certain diseases, such as type 2 diabetes and high blood pressure. ?Heart rate and blood pressure. ?Body temperature. ?Skin for abnormal spots. ?What immunizations do I need? ?Vaccines are usually given at various ages, according to a schedule. Your health care provider will recommend vaccines for you based on your age, medical history, and lifestyle or other factors, such as travel or where you work. ?What tests do I need? ?Screening ?Your health care provider may recommend screening tests for certain conditions. This may include: ?Lipid and cholesterol levels. ?Diabetes screening. This is done by checking your blood sugar (glucose) after you have not eaten for a while (fasting). ?Hepatitis B test. ?Hepatitis C test. ?HIV (human immunodeficiency virus) test. ?STI (sexually transmitted infection)  testing, if you are at risk. ?Talk with your health care provider about your test results, treatment options, and if necessary, the need for more tests. ?Follow these instructions at home: ?Eating and drinking ? ?Eat a healthy diet that includes fresh fruits and vegetables, whole grains, lean protein, and low-fat dairy products. ?Drink enough fluid to keep your urine pale yellow. ?Take vitamin and mineral supplements as recommended by your health care provider. ?Do not drink alcohol if your health care provider tells you not to drink. ?If you drink alcohol: ?Limit how much you have to 0-2 drinks a day. ?Know how much alcohol is in your drink. In the [place] one drink equals one 12 oz bottle of beer (355 mL), one 5 oz glass of wine (148 mL), or one 1? oz glass of hard liquor (44 mL). ?Lifestyle ?Brush your teeth every morning and night with fluoride toothpaste. Floss one time each day. ?Exercise for at least 30 minutes 5 or more days each week. ?Do not use any products that contain nicotine or tobacco. These products include cigarettes, chewing tobacco, and vaping devices, such as e-cigarettes. If you need help quitting, ask your health care provider. ?Do not use drugs. ?If you are sexually active, practice safe sex. Use a condom or other form of protection to prevent STIs. ?Find healthy ways to manage stress, such as: ?Meditation, yoga, or listening to music. ?Journaling. ?Talking to a trusted person. ?Spending time with friends and family. ?Minimize exposure to UV radiation to reduce your risk of skin cancer. ?Safety ?Always wear your seat belt while driving or riding in a vehicle. ?Do not drive: ?If you have been drinking alcohol. Do not ride with someone who has been drinking. ?If you have been using any mind-altering substances or   drugs. ?While texting. ?When you are tired or distracted. ?Wear a helmet and other protective equipment during sports activities. ?If you have firearms in your house, make sure you  follow all gun safety procedures. ?Seek help if you have been physically or sexually abused. ?What's next? ?Go to your health care provider once a year for an annual wellness visit. ?Ask your health care provider how often you should have your eyes and teeth checked. ?Stay up to date on all vaccines. ?This information is not intended to replace advice given to you by your health care provider. Make sure you discuss any questions you have with your health care provider. ?Document Revised: 05/17/2021 Document Reviewed: 05/17/2021 ?Elsevier Patient Education ? 2022 Elsevier Inc. ? ?

## 2022-02-16 ENCOUNTER — Encounter: Payer: Self-pay | Admitting: Family Medicine

## 2022-02-16 DIAGNOSIS — R21 Rash and other nonspecific skin eruption: Secondary | ICD-10-CM

## 2022-02-18 DIAGNOSIS — Z0000 Encounter for general adult medical examination without abnormal findings: Secondary | ICD-10-CM | POA: Insufficient documentation

## 2022-02-18 NOTE — Assessment & Plan Note (Signed)
Well adult ?Orders Placed This Encounter  ?Procedures  ?? COMPLETE METABOLIC PANEL WITH GFR  ?? CBC with Differential  ?? Lipid Panel w/reflex Direct LDL  ?? TSH  ?? Testosterone Total,Free,Bio, Males  ?Screenings: Per lab orders.  ?Immunizations: UTD ?Anticipatory guidance/risk factor reduction: Recommendations per AVS. ?

## 2022-02-18 NOTE — Progress Notes (Signed)
?Richard Long - 31 y.o. male MRN [id number]  Date of birth: 07/27/1991 ? ?Subjective ?Chief Complaint  ?Patient presents with  ? Annual Exam  ? ? ?HPI ?Richard Long is a 31 year old male here today for annual exam.  Overall he is doing well at this time.  Has rash on his neck and.  Area.  Has tried a few over-the-counter topicals without improvement. ? ?Continues to have some difficulty with weight loss.  Previously study with moderate OSA.  He is interested in pursuing treatment of this. ? ?Continues to exercise regularly.  Feels like his diet is pretty healthy. ? ?He is a non-smoker.  He does consume alcohol occasionally. ? ?Review of Systems  ?Constitutional:  Negative for chills, fever, malaise/fatigue and weight loss.  ?HENT:  Negative for congestion, ear pain and sore throat.   ?Eyes:  Negative for blurred vision, double vision and pain.  ?Respiratory:  Negative for cough and shortness of breath.   ?Cardiovascular:  Negative for chest pain and palpitations.  ?Gastrointestinal:  Negative for abdominal pain, blood in stool, constipation, heartburn and nausea.  ?Genitourinary:  Negative for dysuria and urgency.  ?Musculoskeletal:  Negative for joint pain and myalgias.  ?Neurological:  Negative for dizziness and headaches.  ?Endo/Heme/Allergies:  Does not bruise/bleed easily.  ?Psychiatric/Behavioral:  Negative for depression. The patient is not nervous/anxious and does not have insomnia.   ? ?Allergies  ?Allergen Reactions  ? Shellfish Allergy Anaphylaxis and Itching  ? Shrimp Extract Allergy Skin Test   ? Tape Rash  ?  Burn  ? ? ?Past Medical History:  ?Diagnosis Date  ? Hypertension   ? Thyroid disease   ? ? ?Past Surgical History:  ?Procedure Laterality Date  ? CYST REMOVAL HAND    ? ? ?Social History  ? ?Socioeconomic History  ? Marital status: Married  ?  Spouse name: Not on file  ? Number of children: Not on file  ? Years of education: Not on file  ? Highest education level: Not on file  ?Occupational  History  ? Occupation: Emergency planning/management officer  ?Tobacco Use  ? Smoking status: Never  ? Smokeless tobacco: Never  ?Vaping Use  ? Vaping Use: Never used  ?Substance and Sexual Activity  ? Alcohol use: Yes  ?  Comment: social  ? Drug use: Never  ? Sexual activity: Yes  ?  Birth control/protection: None  ?Other Topics Concern  ? Not on file  ?Social History Narrative  ? Not on file  ? ?Social Determinants of Health  ? ?Financial Resource Strain: Not on file  ?Food Insecurity: Not on file  ?Transportation Needs: Not on file  ?Physical Activity: Not on file  ?Stress: Not on file  ?Social Connections: Not on file  ? ? ?Family History  ?Problem Relation Age of Onset  ? Hyperlipidemia Mother   ? Diabetes Father   ? Diabetes Maternal Grandmother   ? Heart attack Maternal Grandfather   ? Diabetes Maternal Grandfather   ? Heart attack Paternal Grandfather   ? Heart disease Paternal Grandfather   ? ? ?Health Maintenance  ?Topic Date Due  ? HIV Screening  Never done  ? Hepatitis C Screening  Never done  ? INFLUENZA VACCINE  03/02/2022 (Originally 07/03/2021)  ? TETANUS/TDAP  02/09/2028  ? HPV VACCINES  Aged Out  ? COVID-19 Vaccine  Discontinued  ? ? ? ?----------------------------------------------------------------------------------------------------------------------------------------------------------------------------------------------------------------- ?Physical Exam ?BP 120/77 (BP Location: Left Arm, Patient Position: Sitting, Cuff Size: Large)   Pulse 68   Temp 98.3 ?  F (36.8 ?C)   Ht 5\' 5"  (1.651 m)   Wt 245 lb (111.1 kg)   SpO2 99%   BMI 40.77 kg/m?  ? ?Physical Exam ?Constitutional:   ?   General: He is not in acute distress. ?HENT:  ?   Head: Normocephalic and atraumatic.  ?   Right Ear: Tympanic membrane and external ear normal.  ?   Left Ear: Tympanic membrane and external ear normal.  ?Eyes:  ?   General: No scleral icterus. ?Neck:  ?   Thyroid: No thyromegaly.  ?Cardiovascular:  ?   Rate and Rhythm: Normal rate and  regular rhythm.  ?   Heart sounds: Normal heart sounds.  ?Pulmonary:  ?   Effort: Pulmonary effort is normal.  ?   Breath sounds: Normal breath sounds.  ?Abdominal:  ?   General: Bowel sounds are normal. There is no distension.  ?   Palpations: Abdomen is soft.  ?   Tenderness: There is no abdominal tenderness. There is no guarding.  ?Musculoskeletal:  ?   Cervical back: Normal range of motion.  ?Lymphadenopathy:  ?   Cervical: No cervical adenopathy.  ?Skin: ?   General: Skin is warm and dry.  ?   Findings: No rash.  ?Neurological:  ?   Mental Status: He is alert and oriented to person, place, and time.  ?   Cranial Nerves: No cranial nerve deficit.  ?   Motor: No abnormal muscle tone.  ?Psychiatric:     ?   Mood and Affect: Mood normal.     ?   Behavior: Behavior normal.  ? ? ?------------------------------------------------------------------------------------------------------------------------------------------------------------------------------------------------------------------- ?Assessment and Plan ? ?Well adult exam ?Well adult ?Orders Placed This Encounter  ?Procedures  ? COMPLETE METABOLIC PANEL WITH GFR  ? CBC with Differential  ? Lipid Panel w/reflex Direct LDL  ? TSH  ? Testosterone Total,Free,Bio, Males  ?Screenings: Per lab orders.  ?Immunizations: UTD ?Anticipatory guidance/risk factor reduction: Recommendations per AVS. ? ?OSA (obstructive sleep apnea) ?He would like to proceed with CPAP therapy.  Orders placed. ? ? ?Meds ordered this encounter  ?Medications  ? clindamycin (CLINDAGEL) 1 % gel  ?  Sig: Apply topically 2 (two) times daily.  ?  Dispense:  30 g  ?  Refill:  0  ? ? ?No follow-ups on file. ? ? ? ?This visit occurred during the SARS-CoV-2 public health emergency.  Safety protocols were in place, including screening questions prior to the visit, additional usage of staff PPE, and extensive cleaning of exam room while observing appropriate contact time as indicated for disinfecting  solutions.  ? ?

## 2022-02-18 NOTE — Assessment & Plan Note (Signed)
He would like to proceed with CPAP therapy.  Orders placed. ?

## 2022-03-12 ENCOUNTER — Other Ambulatory Visit: Payer: Self-pay | Admitting: Family Medicine

## 2022-03-17 ENCOUNTER — Encounter: Payer: Self-pay | Admitting: Family Medicine

## 2022-03-17 LAB — LIPID PANEL W/REFLEX DIRECT LDL
Cholesterol: 166 mg/dL (ref <200)
HDL: 48 mg/dL (ref >=40)
LDL Cholesterol (Calc): 97 mg/dL (calc)
Non-HDL Cholesterol (Calc): 118 mg/dL (calc) (ref <130)
Total CHOL/HDL Ratio: 3.5 (calc) (ref <5.0)
Triglycerides: 110 mg/dL (ref <150)

## 2022-03-17 LAB — COMPLETE METABOLIC PANEL WITH GFR
AG Ratio: 1.8 (calc) (ref 1.0–2.5)
ALT: 35 U/L (ref 9–46)
AST: 19 U/L (ref 10–40)
Albumin: 4.3 g/dL (ref 3.6–5.1)
Alkaline phosphatase (APISO): 56 U/L (ref 36–130)
BUN: 15 mg/dL (ref 7–25)
CO2: 26 mmol/L (ref 20–32)
Calcium: 9.3 mg/dL (ref 8.6–10.3)
Chloride: 105 mmol/L (ref 98–110)
Creat: 1.05 mg/dL (ref 0.60–1.26)
Globulin: 2.4 g/dL (calc) (ref 1.9–3.7)
Glucose, Bld: 102 mg/dL — ABNORMAL HIGH (ref 65–99)
Potassium: 4.1 mmol/L (ref 3.5–5.3)
Sodium: 143 mmol/L (ref 135–146)
Total Bilirubin: 0.7 mg/dL (ref 0.2–1.2)
Total Protein: 6.7 g/dL (ref 6.1–8.1)
eGFR: 97 mL/min/{1.73_m2} (ref >=60)

## 2022-03-17 LAB — TESTOSTERONE TOTAL,FREE,BIO, MALES
Albumin: 4.3 g/dL (ref 3.6–5.1)
Sex Hormone Binding: 25 nmol/L (ref 10–50)
Testosterone, Bioavailable: 107.9 ng/dL — ABNORMAL LOW (ref 110.0–575.0)
Testosterone, Free: 54.8 pg/mL (ref 46.0–224.0)
Testosterone: 338 ng/dL (ref 250–827)

## 2022-03-17 LAB — CBC WITH DIFFERENTIAL/PLATELET
Absolute Monocytes: 367 cells/uL (ref 200–950)
Basophils Absolute: 31 cells/uL (ref 0–200)
Basophils Relative: 0.6 %
Eosinophils Absolute: 117 cells/uL (ref 15–500)
Eosinophils Relative: 2.3 %
HCT: 45.1 % (ref 38.5–50.0)
Hemoglobin: 14.9 g/dL (ref 13.2–17.1)
Lymphs Abs: 1974 cells/uL (ref 850–3900)
MCH: 29.1 pg (ref 27.0–33.0)
MCHC: 33 g/dL (ref 32.0–36.0)
MCV: 88.1 fL (ref 80.0–100.0)
MPV: 10.2 fL (ref 7.5–12.5)
Monocytes Relative: 7.2 %
Neutro Abs: 2611 cells/uL (ref 1500–7800)
Neutrophils Relative %: 51.2 %
Platelets: 231 10*3/uL (ref 140–400)
RBC: 5.12 10*6/uL (ref 4.20–5.80)
RDW: 12.2 % (ref 11.0–15.0)
Total Lymphocyte: 38.7 %
WBC: 5.1 10*3/uL (ref 3.8–10.8)

## 2022-03-17 LAB — TSH: TSH: 1.71 mIU/L (ref 0.40–4.50)

## 2022-04-02 ENCOUNTER — Encounter: Payer: Self-pay | Admitting: Family Medicine

## 2022-04-04 ENCOUNTER — Other Ambulatory Visit: Payer: Self-pay | Admitting: Family Medicine

## 2022-04-04 DIAGNOSIS — R21 Rash and other nonspecific skin eruption: Secondary | ICD-10-CM

## 2022-04-05 ENCOUNTER — Telehealth: Payer: Self-pay | Admitting: Neurology

## 2022-04-05 LAB — CBC WITH DIFFERENTIAL/PLATELET
Absolute Monocytes: 302 cells/uL (ref 200–950)
Basophils Absolute: 38 cells/uL (ref 0–200)
Basophils Relative: 0.7 %
Eosinophils Absolute: 103 cells/uL (ref 15–500)
Eosinophils Relative: 1.9 %
HCT: 47.6 % (ref 38.5–50.0)
Hemoglobin: 15.9 g/dL (ref 13.2–17.1)
Lymphs Abs: 1852 cells/uL (ref 850–3900)
MCH: 29.4 pg (ref 27.0–33.0)
MCHC: 33.4 g/dL (ref 32.0–36.0)
MCV: 88 fL (ref 80.0–100.0)
MPV: 10 fL (ref 7.5–12.5)
Monocytes Relative: 5.6 %
Neutro Abs: 3105 cells/uL (ref 1500–7800)
Neutrophils Relative %: 57.5 %
Platelets: 245 10*3/uL (ref 140–400)
RBC: 5.41 10*6/uL (ref 4.20–5.80)
RDW: 12.3 % (ref 11.0–15.0)
Total Lymphocyte: 34.3 %
WBC: 5.4 10*3/uL (ref 3.8–10.8)

## 2022-04-05 LAB — URINALYSIS, ROUTINE W REFLEX MICROSCOPIC
Bilirubin Urine: NEGATIVE
Glucose, UA: NEGATIVE
Hgb urine dipstick: NEGATIVE
Ketones, ur: NEGATIVE
Leukocytes,Ua: NEGATIVE
Nitrite: NEGATIVE
Protein, ur: NEGATIVE
Specific Gravity, Urine: 1.026 (ref 1.001–1.035)
pH: 5.5 (ref 5.0–8.0)

## 2022-04-05 LAB — COMPLETE METABOLIC PANEL WITH GFR
AG Ratio: 1.8 (calc) (ref 1.0–2.5)
ALT: 33 U/L (ref 9–46)
AST: 18 U/L (ref 10–40)
Albumin: 4.6 g/dL (ref 3.6–5.1)
Alkaline phosphatase (APISO): 65 U/L (ref 36–130)
BUN: 13 mg/dL (ref 7–25)
CO2: 26 mmol/L (ref 20–32)
Calcium: 9.2 mg/dL (ref 8.6–10.3)
Chloride: 105 mmol/L (ref 98–110)
Creat: 1.06 mg/dL (ref 0.60–1.26)
Globulin: 2.5 g/dL (calc) (ref 1.9–3.7)
Glucose, Bld: 93 mg/dL (ref 65–99)
Potassium: 4.5 mmol/L (ref 3.5–5.3)
Sodium: 140 mmol/L (ref 135–146)
Total Bilirubin: 0.7 mg/dL (ref 0.2–1.2)
Total Protein: 7.1 g/dL (ref 6.1–8.1)
eGFR: 96 mL/min/{1.73_m2} (ref >=60)

## 2022-04-05 LAB — ANA,IFA RA DIAG PNL W/RFLX TIT/PATN
Anti Nuclear Antibody (ANA): NEGATIVE
Cyclic Citrullin Peptide Ab: <16 UNITS
Rheumatoid fact SerPl-aCnc: <14 IU/mL (ref <14)

## 2022-04-05 LAB — SEDIMENTATION RATE: Sed Rate: 2 mm/h (ref 0–15)

## 2022-04-05 NOTE — Telephone Encounter (Signed)
Patient reviewed lab results, LVM and wants to know if there is anything further he needs besides the lupus antibody test?  ? ?Looks like lab still pending.  ?

## 2022-04-06 ENCOUNTER — Encounter: Payer: Self-pay | Admitting: Family Medicine

## 2022-06-19 ENCOUNTER — Encounter: Payer: Self-pay | Admitting: Family Medicine

## 2022-06-19 ENCOUNTER — Telehealth (INDEPENDENT_AMBULATORY_CARE_PROVIDER_SITE_OTHER): Payer: Managed Care, Other (non HMO) | Admitting: Family Medicine

## 2022-06-19 DIAGNOSIS — R197 Diarrhea, unspecified: Secondary | ICD-10-CM | POA: Diagnosis not present

## 2022-06-19 NOTE — Progress Notes (Signed)
Richard Long - 31 y.o. male MRN [id number]  Date of birth: 18-Nov-1991   This visit type was conducted due to national recommendations for restrictions regarding the COVID-19 Pandemic (e.g. social distancing).  This format is felt to be most appropriate for this patient at this time.  All issues noted in this document were discussed and addressed.  No physical exam was performed (except for noted visual exam findings with Video Visits).  I discussed the limitations of evaluation and management by telemedicine and the availability of in person appointments. The patient expressed understanding and agreed to proceed.  I connected withNAME@ on 06/19/22 at  1:10 PM EDT by a video enabled telemedicine application and verified that I am speaking with the correct person using two identifiers.  Present at visit: Richard Coombe, DO [name]   Patient Location: Home [address] [place] [address]   Provider location:   [facility]  No chief complaint on file.   HPI  Richard Long is a 31 y.o. male who presents via audio/video conferencing for a telehealth visit today.  He has complaint of diarrhea today.  Symptoms started about 5 days ago.  Had watery diarrhea several times per day. Improved with immodium.  Denies having nausea or vomiting with this.  He has not had abdominal pain. No blood in his stool.  He is hydrating well and following a bland diet.  Symptoms seem much better today.    ROS:  A comprehensive ROS was completed and negative except as noted per HPI  Past Medical History:  Diagnosis Date   Hypertension    Thyroid disease     Past Surgical History:  Procedure Laterality Date   CYST REMOVAL HAND      Family History  Problem Relation Age of Onset   Hyperlipidemia Mother    Diabetes Father    Diabetes Maternal Grandmother    Heart attack Maternal Grandfather    Diabetes Maternal Grandfather    Heart attack Paternal Grandfather     Heart disease Paternal Grandfather     Social History   Socioeconomic History   Marital status: Married    Spouse name: Not on file   Number of children: Not on file   Years of education: Not on file   Highest education level: Not on file  Occupational History   Occupation: Emergency planning/management officer  Tobacco Use   Smoking status: Never   Smokeless tobacco: Never  Vaping Use   Vaping Use: Never used  Substance and Sexual Activity   Alcohol use: Yes    Comment: social   Drug use: Never   Sexual activity: Yes    Birth control/protection: None  Other Topics Concern   Not on file  Social History Narrative   Not on file   Social Determinants of Health   Financial Resource Strain: Not on file  Food Insecurity: Not on file  Transportation Needs: Not on file  Physical Activity: Not on file  Stress: Not on file  Social Connections: Not on file  Intimate Partner Violence: Not on file     Current Outpatient Medications:    amLODipine (NORVASC) 2.5 MG tablet, Take 1 tablet (2.5 mg total) by mouth daily., Disp: 90 tablet, Rfl: 3   clindamycin (CLINDAGEL) 1 % gel, APPLY TOPICALLY TWICE A DAY, Disp: 30 g, Rfl: 0   rosuvastatin (CRESTOR) 10 MG tablet, Take 1 tablet (10 mg total) by mouth daily., Disp: 90 tablet, Rfl: 3  EXAM:  VITALS per patient if applicable: There were no vitals taken for this visit.  GENERAL: alert, oriented, appears well and in no acute distress  HEENT: atraumatic, conjunttiva clear, no obvious abnormalities on inspection of external nose and ears  NECK: normal movements of the head and neck  LUNGS: on inspection no signs of respiratory distress, breathing rate appears normal, no obvious gross SOB, gasping or wheezing  CV: no obvious cyanosis  MS: moves all visible extremities without noticeable abnormality  PSYCH/NEURO: pleasant and cooperative, no obvious depression or anxiety, speech and thought processing grossly intact  ASSESSMENT AND PLAN:  Discussed  the following assessment and plan:  Diarrhea Likely viral etiology.  Symptoms improved at this poin.  Note provided for time missed from work.   Recommend continue to follow a bland diet for the next 1-2 days and push fluid.  Contact clinic if symptoms return/worsen.       I discussed the assessment and treatment plan with the patient. The patient was provided an opportunity to ask questions and all were answered. The patient agreed with the plan and demonstrated an understanding of the instructions.   The patient was advised to call back or seek an in-person evaluation if the symptoms worsen or if the condition fails to improve as anticipated.    Richard Coombe, DO

## 2022-06-19 NOTE — Assessment & Plan Note (Signed)
Likely viral etiology.  Symptoms improved at this poin.  Note provided for time missed from work.   Recommend continue to follow a bland diet for the next 1-2 days and push fluid.  Contact clinic if symptoms return/worsen.

## 2022-06-25 ENCOUNTER — Ambulatory Visit: Payer: Managed Care, Other (non HMO) | Admitting: Family Medicine

## 2022-06-26 ENCOUNTER — Ambulatory Visit (INDEPENDENT_AMBULATORY_CARE_PROVIDER_SITE_OTHER): Payer: Managed Care, Other (non HMO) | Admitting: Family Medicine

## 2022-06-26 VITALS — Ht 65.5 in | Wt 241.0 lb

## 2022-06-26 DIAGNOSIS — Z0100 Encounter for examination of eyes and vision without abnormal findings: Secondary | ICD-10-CM

## 2022-06-26 NOTE — Progress Notes (Signed)
   Established Patient Office Visit  Subjective   Patient ID: Richard Long, male    DOB: [birth year]  Age: 31 y.o. MRN: [id number]  Chief Complaint  Patient presents with   Vision check    HPI  Richard Long is here for vision and hearing test for the [facility].   ROS    Objective:     Ht 5' 5.5" (1.664 m)   Wt 241 lb (109.3 kg)   BMI 39.49 kg/m    Physical Exam   No results found for any visits on 06/26/22.    The ASCVD Risk score (Arnett DK, et al., 2019) failed to calculate for the following reasons:   The 2019 ASCVD risk score is only valid for ages 90 to 69    Assessment & Plan:  Vision Normal - Color Perception Normal - Peripheral Normal - Hearing Normal.   Problem List Items Addressed This Visit   None Visit Diagnoses     Vision test    -  Primary       No follow-ups on file.    Esmond Harps, CMA

## 2022-06-26 NOTE — Progress Notes (Signed)
Medical screening examination/treatment was performed by qualified clinical staff member and as supervising physician I was immediately available for consultation/collaboration. I have reviewed documentation and agree with assessment and plan.  Jimy Gates, DO  

## 2022-07-17 ENCOUNTER — Other Ambulatory Visit: Payer: Self-pay | Admitting: Family Medicine

## 2022-07-17 DIAGNOSIS — L932 Other local lupus erythematosus: Secondary | ICD-10-CM

## 2022-07-21 LAB — CBC WITH DIFFERENTIAL/PLATELET
Absolute Monocytes: 320 cells/uL (ref 200–950)
Basophils Absolute: 30 cells/uL (ref 0–200)
Basophils Relative: 0.6 %
Eosinophils Absolute: 90 cells/uL (ref 15–500)
Eosinophils Relative: 1.8 %
HCT: 44.6 % (ref 38.5–50.0)
Hemoglobin: 15.2 g/dL (ref 13.2–17.1)
Lymphs Abs: 1805 cells/uL (ref 850–3900)
MCH: 30.2 pg (ref 27.0–33.0)
MCHC: 34.1 g/dL (ref 32.0–36.0)
MCV: 88.5 fL (ref 80.0–100.0)
MPV: 9.9 fL (ref 7.5–12.5)
Monocytes Relative: 6.4 %
Neutro Abs: 2755 cells/uL (ref 1500–7800)
Neutrophils Relative %: 55.1 %
Platelets: 223 10*3/uL (ref 140–400)
RBC: 5.04 10*6/uL (ref 4.20–5.80)
RDW: 12.5 % (ref 11.0–15.0)
Total Lymphocyte: 36.1 %
WBC: 5 10*3/uL (ref 3.8–10.8)

## 2022-07-21 LAB — HEPATIC FUNCTION PANEL
AG Ratio: 2 (calc) (ref 1.0–2.5)
ALT: 27 U/L (ref 9–46)
AST: 15 U/L (ref 10–40)
Albumin: 4.5 g/dL (ref 3.6–5.1)
Alkaline phosphatase (APISO): 56 U/L (ref 36–130)
Bilirubin, Direct: 0.1 mg/dL (ref 0.0–0.2)
Globulin: 2.2 g/dL (calc) (ref 1.9–3.7)
Indirect Bilirubin: 0.4 mg/dL (calc) (ref 0.2–1.2)
Total Bilirubin: 0.5 mg/dL (ref 0.2–1.2)
Total Protein: 6.7 g/dL (ref 6.1–8.1)

## 2022-07-21 LAB — GLUCOSE 6 PHOSPHATE DEHYDROGENASE: G-6PDH: 16.3 U/g Hgb (ref 7.0–20.5)

## 2022-09-09 IMAGING — CR DG KNEE COMPLETE 4+V*L*
4 series · 4 of 4 positions shown · non-contrast
Comparison: None.

CLINICAL DATA: Left knee pain.  Trauma to knee.

EXAM:
LEFT KNEE - COMPLETE 4+ VIEW

[t knee ap left]
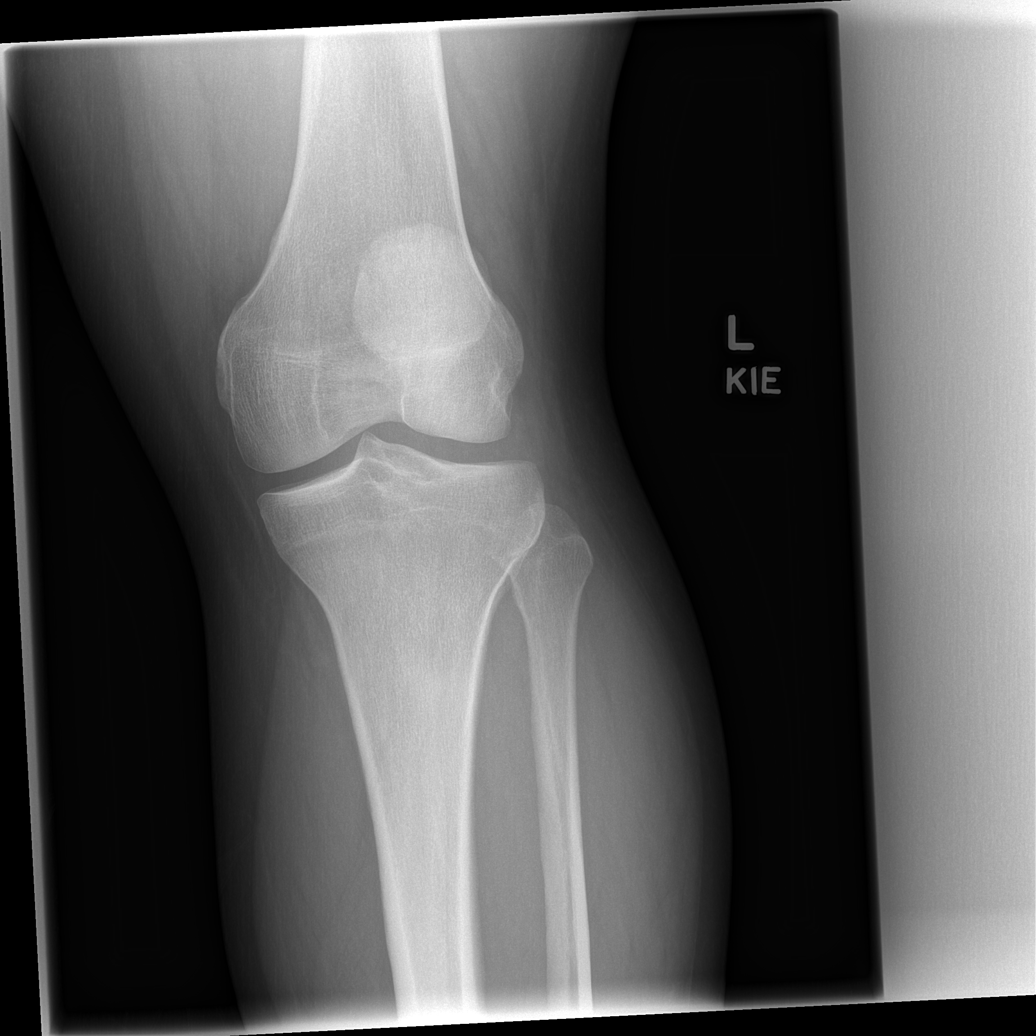

[t knee oblique left (1 of 2)]
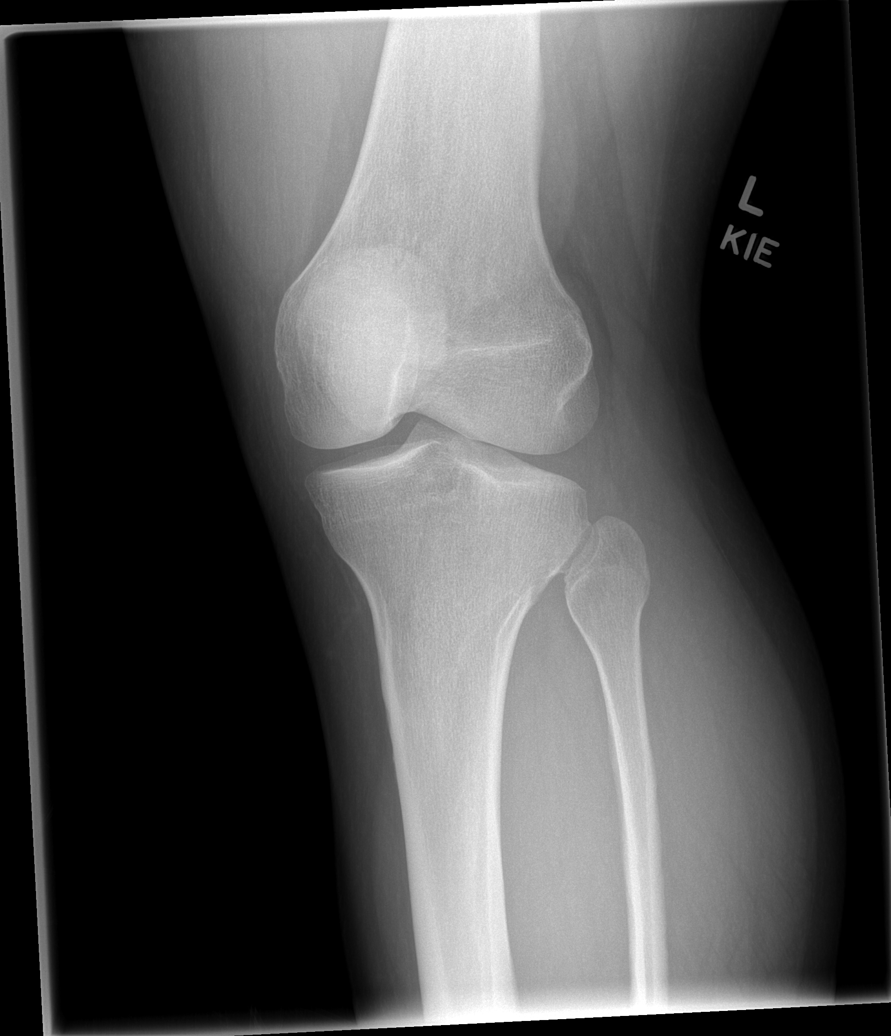

[t knee oblique left (2 of 2)]
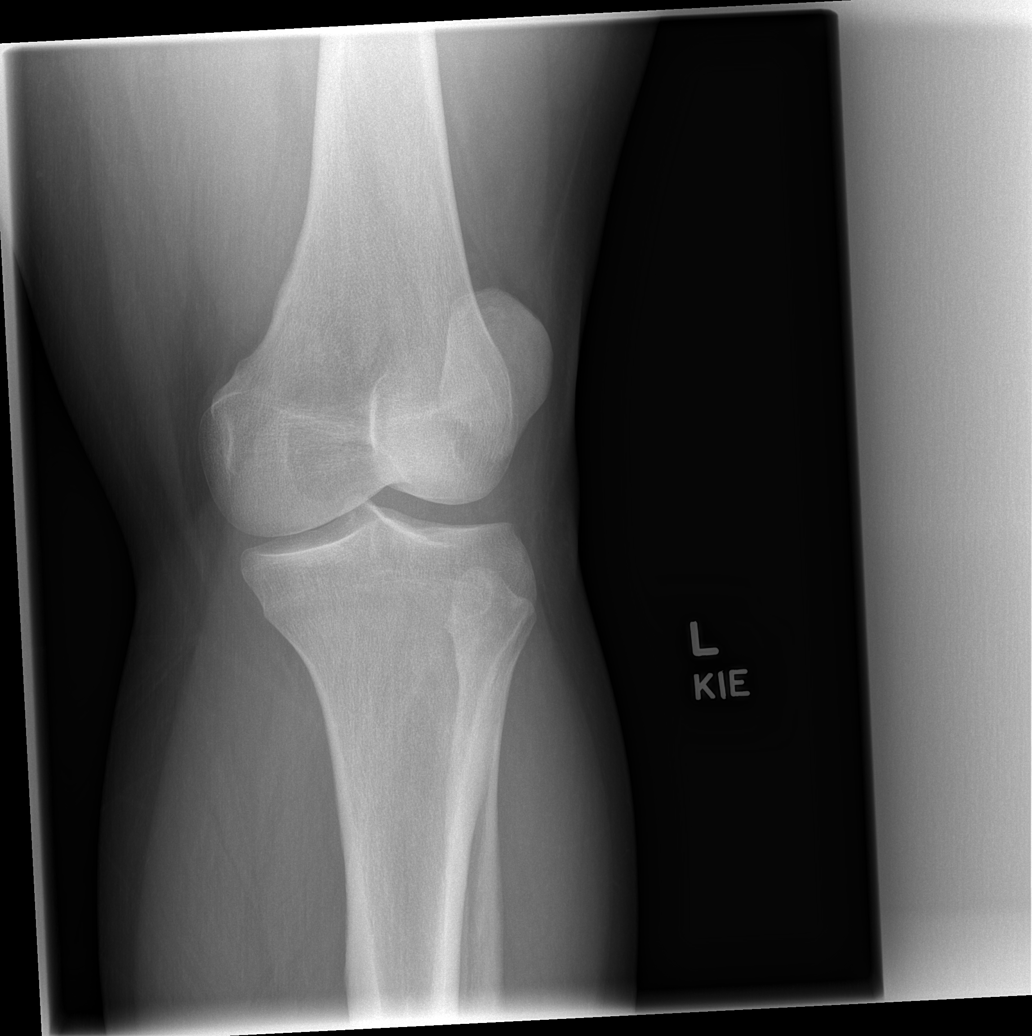

[t knee lat left]
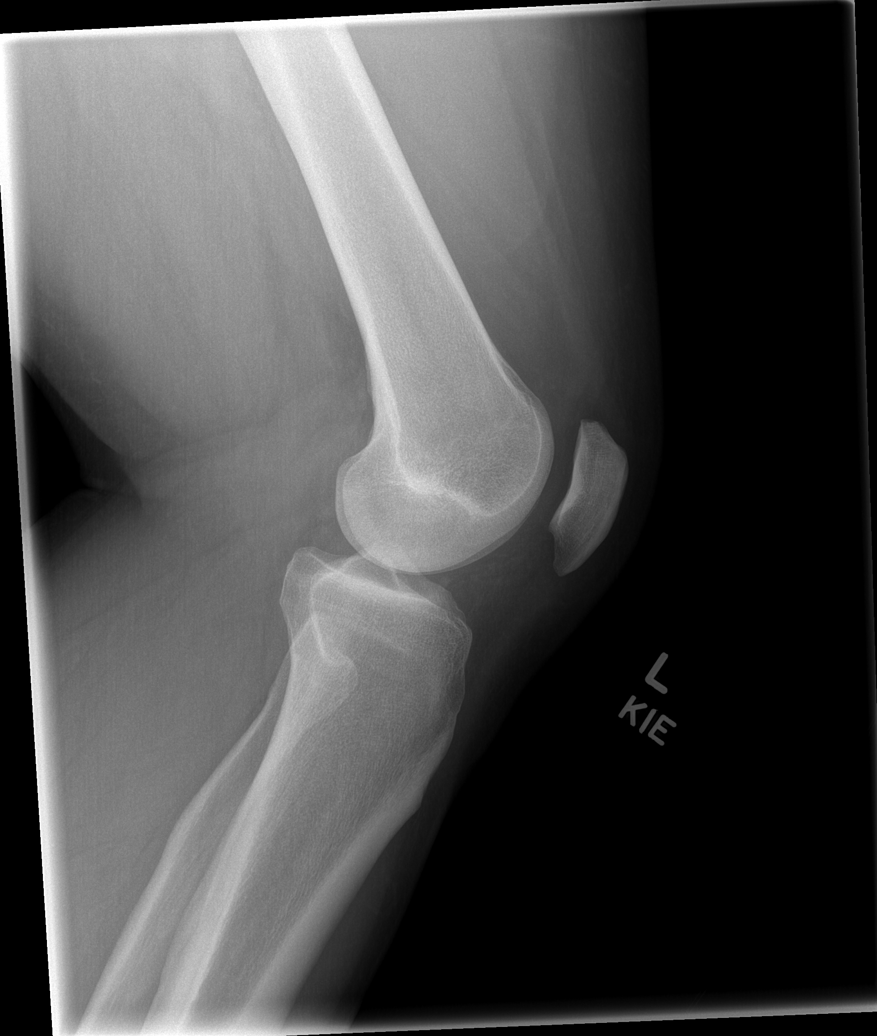

[4 of 4 positions shown; findings below may reference images not displayed]

FINDINGS: No evidence of fracture, dislocation, or joint effusion. No evidence
of arthropathy or other focal bone abnormality. Soft tissues are
unremarkable.
IMPRESSION: Negative left knee radiographs.

## 2022-10-24 ENCOUNTER — Ambulatory Visit: Payer: Managed Care, Other (non HMO) | Admitting: Physician Assistant

## 2022-10-24 ENCOUNTER — Encounter: Payer: Self-pay | Admitting: Physician Assistant

## 2022-10-24 VITALS — BP 128/75 | HR 82 | Temp 98.0°F | Ht 65.5 in | Wt 240.0 lb

## 2022-10-24 DIAGNOSIS — J0100 Acute maxillary sinusitis, unspecified: Secondary | ICD-10-CM

## 2022-10-24 MED ORDER — AMOXICILLIN-POT CLAVULANATE 875-125 MG PO TABS: 1.0000 | ORAL_TABLET | Freq: Two times a day (BID) | ORAL | 0 refills | Status: DC

## 2022-10-24 MED ORDER — FLUTICASONE PROPIONATE 50 MCG/ACT NA SUSP: 2.0000 | Freq: Every day | NASAL | 2 refills | Status: DC

## 2022-10-24 NOTE — Progress Notes (Signed)
Acute Office Visit  Subjective:     Patient ID: Richard Long, male    DOB: Mar 27, 1991, 31 y.o.   MRN: [id number]  Chief Complaint  Patient presents with   Sinus Problem    HPI Patient is in today for sinus pressure, ear popping and pain, sinus drainage and cough for the last 5 days. His son is sick as well. No fever, chills, body aches, SOB. Taking OTC coricidin with some relief. Would like to feel better before thanksgiving. Never had covid and never had vaccine. 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   .. 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     Objective:    There were no vitals taken for this visit. BP Readings from Last 3 Encounters:  02/13/22 120/77  01/16/22 134/78  08/11/21 (!) 134/94   Wt Readings from Last 3 Encounters:  06/26/22 241 lb (109.3 kg)  02/13/22 245 lb (111.1 kg)  01/16/22 233 lb 12.8 oz (106.1 kg)      Physical Exam Constitutional:      Appearance: Normal appearance.  HENT:     Right Ear: Ear canal and external ear normal. There is no impacted cerumen.     Left Ear: Ear canal and external ear  normal. There is no impacted cerumen.     Ears:     Comments: Bilateral bulging TM with erythema    Nose: Nose normal.     Mouth/Throat:     Mouth: Mucous membranes are moist.     Pharynx: Posterior oropharyngeal erythema present.  Eyes:     General:        Right eye: No discharge.        Left eye: No discharge.     Conjunctiva/sclera: Conjunctivae normal.     Pupils: Pupils are equal, round, and reactive to light.  Cardiovascular:     Rate and Rhythm: Normal rate and regular rhythm.  Pulmonary:     Effort: Pulmonary effort is normal.     Breath sounds: Normal breath sounds.  Musculoskeletal:     Cervical back: Normal range of motion and neck supple. No tenderness.     Right lower leg: No edema.     Left lower leg: No edema.  Lymphadenopathy:     Cervical: No cervical adenopathy.  Neurological:     General: No focal deficit present.     Mental Status: He is alert and oriented to person, place, and time.  Psychiatric:        Mood and Affect: Mood normal.  Assessment & Plan:  Marland KitchenMarland KitchenParke was seen today for sinus problem.  Diagnoses and all orders for this visit:  Acute non-recurrent maxillary sinusitis -     amoxicillin-clavulanate (AUGMENTIN) 875-125 MG tablet; Take 1 tablet by mouth 2 (two) times daily. -     fluticasone (FLONASE) 50 MCG/ACT nasal spray; Place 2 sprays into both nostrils daily.   Discussed symptomatic care Declined covid testing Pt is immunocompromised on plaquenil Start augmentin and flonase Follow up as needed or if symptoms worsen or persist.   Tandy Gaw, PA-C

## 2022-10-24 NOTE — Patient Instructions (Signed)

## 2022-11-10 ENCOUNTER — Encounter: Payer: Self-pay | Admitting: Family Medicine

## 2022-11-10 DIAGNOSIS — G4733 Obstructive sleep apnea (adult) (pediatric): Secondary | ICD-10-CM

## 2022-11-12 NOTE — Telephone Encounter (Signed)
Referral placed to [facility].  He will be contacted for appt.

## 2022-12-11 ENCOUNTER — Ambulatory Visit: Payer: Managed Care, Other (non HMO)

## 2022-12-11 ENCOUNTER — Ambulatory Visit: Payer: Managed Care, Other (non HMO) | Admitting: Sports Medicine

## 2022-12-11 DIAGNOSIS — M25512 Pain in left shoulder: Secondary | ICD-10-CM | POA: Diagnosis not present

## 2022-12-11 DIAGNOSIS — G8929 Other chronic pain: Secondary | ICD-10-CM | POA: Diagnosis not present

## 2022-12-11 DIAGNOSIS — S43432A Superior glenoid labrum lesion of left shoulder, initial encounter: Secondary | ICD-10-CM

## 2022-12-11 MED ORDER — TRIAZOLAM 0.25 MG PO TABS: ORAL_TABLET | ORAL | 0 refills | Status: DC

## 2022-12-11 NOTE — Assessment & Plan Note (Addendum)
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  Unfortunately he has had some injuries since I last saw him, he did have a labral repair and biceps tenodesis with Dr. Onnie Graham on the right side, he is doing well.  Had a PCL tear while wrestling down a suspect. Now having increasing pain left shoulder anterior aspect positive crank test, positive O'Brien's test, weakness of the rotator cuff, positive speeds, Yergason tests as his bicipital signs. He has had months of pain, approximately 6 months, and has failed greater than 6 weeks of physician directed conservative treatment, we will get x-rays and bring him back for MR arthrography, triazolam for preprocedural anxiolysis if he can find a driver.

## 2022-12-11 NOTE — Progress Notes (Signed)
    Procedures performed today:    None.  Independent interpretation of notes and tests performed by another provider:   None.  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  Unfortunately he has had some injuries since I last saw him, he did have a labral repair and biceps tenodesis with Dr. Onnie Graham on the right side, he is doing well.  Had a PCL tear while wrestling down a suspect. Now having increasing pain left shoulder anterior aspect positive crank test, positive O'Brien's test, weakness of the rotator cuff, positive speeds, Yergason tests as his bicipital signs. He has had months of pain, approximately 6 months, and has failed greater than 6 weeks of physician directed conservative treatment, we will get x-rays and bring him back for MR arthrography, triazolam for preprocedural anxiolysis if he can find a driver.  Chronic process with exacerbation of pharmacologic intervention  ____________________________________________ Gwen Her. Dianah Field, M.D., ABFM., CAQSM., AME. Primary Care and Sports Medicine [facility]  Adjunct Professor of [place] [employer] of Medicine  Risk manager

## 2022-12-17 ENCOUNTER — Encounter: Payer: Self-pay | Admitting: Sports Medicine

## 2022-12-17 ENCOUNTER — Ambulatory Visit (INDEPENDENT_AMBULATORY_CARE_PROVIDER_SITE_OTHER): Payer: Managed Care, Other (non HMO)

## 2022-12-17 ENCOUNTER — Ambulatory Visit: Payer: Managed Care, Other (non HMO) | Admitting: Sports Medicine

## 2022-12-17 ENCOUNTER — Ambulatory Visit: Payer: Managed Care, Other (non HMO)

## 2022-12-17 DIAGNOSIS — S43432A Superior glenoid labrum lesion of left shoulder, initial encounter: Secondary | ICD-10-CM | POA: Diagnosis not present

## 2022-12-17 MED ORDER — TRIAMCINOLONE ACETONIDE 40 MG/ML IJ SUSP
40.0000 mg | Freq: Once | INTRAMUSCULAR | Status: AC
  Administered 2022-12-17: 40 mg via INTRAMUSCULAR

## 2022-12-17 NOTE — Progress Notes (Addendum)
    Procedures performed today:    Procedure: Real-time Ultrasound Guided gadolinium contrast injection of left glenohumeral joint Device: Samsung HS60  Verbal informed consent obtained.  Time-out conducted.  Noted no overlying erythema, induration, or other signs of local infection.  Skin prepped in a sterile fashion.  Local anesthesia: Topical Ethyl chloride.  With sterile technique and under real time ultrasound guidance: 22 spinal needle advanced into the glenohumeral joint from a posterior approach, I injected 1 cc kenalog 40, 2 cc lidocaine, 2 cc bupivacaine, syringe switched and 0.1 cc gadolinium injected, syringe again switched and 10 cc sterile saline used to fully distend the joint. Joint visualized and capsule seen distending confirming intra-articular placement of contrast material and medication. Completed without difficulty  Advised to call if fevers/chills, erythema, induration, drainage, or persistent bleeding.  Images permanently stored in PACS Impression: Technically successful ultrasound guided gadolinium contrast injection for MR arthrography.  Please see separate MR arthrogram report.  Independent interpretation of notes and tests performed by another provider:   None.  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  Unfortunately he has had some injuries since I last saw him, he did have a labral repair and biceps tenodesis with Dr. Onnie Graham on the right side, he is doing well.  Had a PCL tear while wrestling down a suspect. Now having increasing pain left shoulder anterior aspect positive crank test, positive O'Brien's test, weakness of the rotator cuff, positive speeds, Yergason tests as his bicipital signs. He has had months of pain, approximately 6 months, and has failed greater than 6 weeks of physician directed conservative  treatment, we will get x-rays and bring him back for MR arthrography, triazolam for preprocedural anxiolysis if he can find a driver.  Update: Today we performed the injection for MR arthrography, results did confirm large SLAP tear with multiple other derangements, referral to Dr. Onnie Graham.    ____________________________________________ Gwen Her. Dianah Field, M.D., ABFM., CAQSM., AME. Primary Care and Sports Medicine [facility]  Adjunct Professor of [place] [employer] [facility] Medicine  Risk manager

## 2022-12-17 NOTE — Addendum Note (Signed)
Addended by: Tarri Glenn A on: 12/17/2022 09:32 AM   Modules accepted: Orders

## 2022-12-17 NOTE — Addendum Note (Signed)
Addended by: Silverio Decamp on: 12/17/2022 10:47 AM   Modules accepted: Orders

## 2022-12-17 NOTE — Assessment & Plan Note (Addendum)
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  Unfortunately he has had some injuries since I last saw him, he did have a labral repair and biceps tenodesis with Dr. Onnie Graham on the right side, he is doing well.  Had a PCL tear while wrestling down a suspect. Now having increasing pain left shoulder anterior aspect positive crank test, positive O'Brien's test, weakness of the rotator cuff, positive speeds, Yergason tests as his bicipital signs. He has had months of pain, approximately 6 months, and has failed greater than 6 weeks of physician directed conservative treatment, we will get x-rays and bring him back for MR arthrography, triazolam for preprocedural anxiolysis if he can find a driver.  Update: Today we performed the injection for MR arthrography, results did confirm large SLAP tear with multiple other derangements, referral to Dr. Onnie Graham.

## 2023-01-02 ENCOUNTER — Encounter: Payer: Self-pay | Admitting: Family Medicine

## 2023-01-02 DIAGNOSIS — G4733 Obstructive sleep apnea (adult) (pediatric): Secondary | ICD-10-CM

## 2023-01-03 NOTE — Telephone Encounter (Signed)
In sleep study referral tab states must return to previous provider - Dr. Tarri Fuller young - pulmonologist in [place]. Pended this referral  for you.

## 2023-02-01 ENCOUNTER — Ambulatory Visit (HOSPITAL_BASED_OUTPATIENT_CLINIC_OR_DEPARTMENT_OTHER): Payer: Managed Care, Other (non HMO) | Attending: Family Medicine | Admitting: Internal Medicine

## 2023-02-01 DIAGNOSIS — G4733 Obstructive sleep apnea (adult) (pediatric): Secondary | ICD-10-CM | POA: Diagnosis not present

## 2023-02-09 DIAGNOSIS — G4733 Obstructive sleep apnea (adult) (pediatric): Secondary | ICD-10-CM

## 2023-02-09 NOTE — Procedures (Signed)
     Patient Name: Richard Long, Richard Long Date: 02/02/2023 Gender: Male D.O.B: 1991/09/22 Age (years): 31 Referring Provider: Rosanne Gutting MD Height (inches): 72 Interpreting Physician: Baird Lyons MD, ABSM Weight (lbs): 245 RPSGT: Jacolyn Reedy BMI: 41 MRN: [id number] Neck Size: 16.50  CLINICAL INFORMATION Sleep Study Type: HST Indication for sleep study: Snoring Epworth Sleepiness Score: 0  SLEEP STUDY TECHNIQUE A multi-channel overnight portable sleep study was performed. The channels recorded were: nasal airflow, thoracic respiratory movement, and oxygen saturation with a pulse oximetry. Snoring was also monitored.  MEDICATIONS Patient self administered medications include: N/A.  SLEEP ARCHITECTURE Patient was studied for 364.4 minutes. The sleep efficiency was 100.0 % and the patient was supine for 0%. The arousal index was 0.0 per hour.  RESPIRATORY PARAMETERS The overall AHI was 21.1 per hour, with a central apnea index of 0 per hour. The oxygen nadir was 86% during sleep.  CARDIAC DATA Mean heart rate during sleep was 68.7 bpm.  IMPRESSIONS - Moderate obstructive sleep apnea occurred during this study (AHI = 21.1/h). - Moderate oxygen desaturation was noted during this study (Min O2 = 86%, Mean 95%). - Patient snored.  DIAGNOSIS - Obstructive Sleep Apnea (G47.33)  RECOMMENDATIONS - Suggest CPAP titration sleep study or autopap. Otheer options wwould be based on clinical judgment. - Be careful with alcohol, sedatives and other CNS depressants that may worsen sleep apnea and disrupt normal sleep architecture. - Sleep hygiene should be reviewed to assess factors that may improve sleep quality. - Weight management and regular exercise should be initiated or continued  [Electronically signed] 02/09/2023 11:06 AM  Baird Lyons MD, ABSM Diplomate, American Board of Sleep Medicine NPI: [id number]                        [place], [place] of Sleep Medicine  ELECTRONICALLY SIGNED ON:  02/09/2023, 11:03 AM [place] PH: [phone]   FX: [phone] [place]

## 2023-02-25 MED ORDER — AMBULATORY NON FORMULARY MEDICATION: 0 refills | Status: AC

## 2023-02-25 NOTE — Addendum Note (Signed)
Addended by: Perlie Mayo on: 02/25/2023 11:48 AM   Modules accepted: Orders

## 2023-02-25 NOTE — Telephone Encounter (Signed)
Orders printed to be faxed to [place].  He should be contacted to get set up for this.    Thanks!  CM

## 2023-02-27 NOTE — Therapy (Unsigned)
OUTPATIENT PHYSICAL THERAPY SHOULDER EVALUATION   Patient Name: Richard Long MRN: [id number] DOB:1991/02/27, 32 y.o., male Today's Date: 02/28/2023  END OF SESSION:  PT End of Session - 02/28/23 1214     Visit Number 1    Number of Visits 16    Date for PT Re-Evaluation 04/24/23    Authorization Type cigna    Authorization Time Period auth required after 5th visit    Authorization - Visit Number 1    Authorization - Number of Visits 20    PT Start Time 1100    PT Stop Time [id number]    PT Time Calculation (min) 52 min    Activity Tolerance Patient tolerated treatment well             Past Medical History:  Diagnosis Date   Hypertension    Thyroid disease    Past Surgical History:  Procedure Laterality Date   CYST REMOVAL HAND     Patient Active Problem List   Diagnosis Date Noted   Labral tear of shoulder, left, initial encounter 12/11/2022   Diarrhea 06/19/2022   Well adult exam 02/18/2022   Strain of left soleus muscle 08/11/2021   OSA (obstructive sleep apnea) 02/10/2020   Decreased libido 02/10/2020   Strain of right shoulder 10/20/2019   History of thyroid disorder 10/27/2018   Hypertension goal BP (blood pressure) < 130/80 10/27/2018   Acute pain of right shoulder 10/27/2018   Class 2 severe obesity due to excess calories with serious comorbidity in adult ([place]) 10/27/2018   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   Vitamin D deficiency 10/07/2017   Chronic tension-type headache, not intractable 04/06/2016   Pure hypercholesterolemia 04/06/2016    PCP: Dr Luetta Nutting   REFERRING PROVIDER: Dr Justice Britain   REFERRING DIAG: Labral tear Lt shoulder   THERAPY DIAG:  Acute pain of left shoulder  Muscle weakness (generalized)  Abnormal posture  Stiffness of left shoulder, not elsewhere classified  Rationale for Evaluation and Treatment: Rehabilitation  ONSET DATE: 02/12/23  SUBJECTIVE:                                                                                                                                                                                       SUBJECTIVE STATEMENT: Patient reports that he has had Lt shoulder pain for the past 6 months with increased pain 1/24. MRI showed labral tear and biceps tendon tear. Underwent Lt shoulder score 02/12/23 with no post op complications.  Hand dominance: Ambidextrous; primarily Rt  PERTINENT HISTORY: Rt shoulder injury with labral repair and biceps tenodesis 2020; Lt knee injury/damage; anxiety; HTN  PAIN:  Are you having pain?  Yes: NPRS scale: 0/10; when he wakes in the AM in biceps 3-5/10 briefly  Pain location: biceps  Pain description: gripping  Aggravating factors: lying flat  Relieving factors: propping   PRECAUTIONS: Shoulder  WEIGHT BEARING RESTRICTIONS: No  FALLS:  Has patient fallen in last 6 months? 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 Superior posterior labral tear with an associated moderate-sized dissecting paralabral cyst back into the spinoglenoid notch. No findings to suggest compression of the suprascapular nerve. Buford complex variant. Mild rotator cuff tendinopathy/tendinosis with small interstitial tears mainly involving the infraspinatus and supraspinatus tendons. No partial or full-thickness rotator cuff tear. Intact long head biceps tendon.Os acromiale.    PATIENT SURVEYS:  FOTO 4 target 38  COGNITION: Overall cognitive status: Within functional limits for tasks assessed     SENSATION: WFL  POSTURE: Patient presents with head forward posture with increased thoracic kyphosis;  shoulders rounded and elevated; scapulae abducted and rotated along the thoracic spine; head of the humerus anterior in orientation.   UPPER EXTREMITY ROM: AROM Rt shoulder assessed in standing; PROM assessed in supine   Active ROM Rt Passive ROM Lt  Right eval Left eval  Shoulder flexion 162 155  Shoulder extension 60 20  Shoulder abduction(in scapular plane) 162 150  Shoulder adduction    Shoulder internal rotation Thumb T7 UE resting at side   Shoulder external rotation 119 shoulder 90/elbow 90 degrees  30 degrees scapular plane Elbow flexed   Elbow flexion WNL   Elbow extension WNL   Wrist flexion WNL   Wrist extension WNL   Wrist ulnar deviation WNL   Wrist radial deviation WNL   Wrist pronation WNL   Wrist supination WNL   (Blank rows = not tested)  UPPER EXTREMITY MMT:  MMT Right eval Left eval  Shoulder flexion    Shoulder extension    Shoulder abduction    Shoulder adduction    Shoulder internal rotation    Shoulder external rotation    Middle trapezius    Lower trapezius    Elbow flexion    Elbow extension    Wrist flexion    Wrist extension    Wrist ulnar deviation    Wrist radial deviation    Wrist pronation    Wrist supination    Grip strength (lbs)    (Blank rows = not tested)  PALPATION:  Muscular tightness Lt pecs; upper trap; shoulder girdle musculature    [facility] Adult PT Treatment:                                                DATE: 02/28/23 Therapeutic Exercise: Standing  Pendulum 30CW/30 CCW Scap squeeze w/noodle 10 sec x 15 Chin tuck w/noodle x 10 sec  AAROM shoulder ER w/ cane elbow 90 deg 10 sec x 10  Shoulder flexion elbow flexed at counter 10 sec x 10  Wall slide Lt UE elbow flexed 3-5 sec x 5  Manual Therapy: PROM Lt shoulder pt supine  shoulder flexion; scaption; ER/IR in scaption; extension within tissue limits with no pain  Neuromuscular re-ed: Working on posture and alignment in standing and sitting  Modalities: Has ice  machine at home  Self Care: Reviewed post op precautions including no lifting; elbow flexion on greater than 45 deg      PATIENT EDUCATION: Education details: POC; HEP  Person educated: Patient Education method: Consulting civil engineer, Demonstration, Tactile cues, Verbal cues, and Handouts Education comprehension: verbalized understanding, returned demonstration, verbal cues required, tactile cues required, and needs further education  HOME EXERCISE PROGRAM: (e-mailed to patient) Access Code: [email or web address] URL: [email or web address] Date: 02/28/2023 Prepared by: Gillermo Murdoch  Exercises - Circular Shoulder Pendulum with Table Support  - 3-4 x daily - 7 x weekly - 1 sets - 20-30 reps - Seated Scapular Retraction  - 2 x daily - 7 x weekly - 1-2 sets - 10 reps - 10 sec  hold - Seated Cervical Retraction  - 2 x daily - 7 x weekly - 1-2 sets - 5-10 reps - 10 sec  hold - Seated Shoulder External Rotation AAROM with Cane and Hand in Neutral  - 2 x daily - 7 x weekly - 1 sets - 5-10 reps - 5-10 sec  hold - Standing 'L' Stretch at Counter  - 2 x daily - 7 x weekly - 1 sets - 5 reps - 10 sec  hold  ASSESSMENT:  CLINICAL IMPRESSION: Patient is a 32 y.o. male who was seen today for physical therapy evaluation and treatment for Lt shoulder surgery for labral repair and biceps tenodesis 02/12/23. He presents with poor posture and alignment; limited Lt shoulder ROM, strength and functional activity. He has minimal pain. Sleeping is difficult when out of the recliner. Patient is unable to work or use Lt UE for functional and recreational activities. He will benefit fro PT to address problems identified.   OBJECTIVE IMPAIRMENTS: decreased activity tolerance, decreased mobility, decreased ROM, decreased strength, hypomobility, increased fascial restrictions, impaired flexibility, impaired sensation, impaired UE functional use, improper body mechanics, postural dysfunction, and pain.   ACTIVITY LIMITATIONS:  carrying, lifting, bending, and reach over head  PARTICIPATION LIMITATIONS: meal prep, cleaning, laundry, occupation, and yard work  PERSONAL FACTORS: Fitness, Past/current experiences, and Time since onset of injury/illness/exacerbation are also affecting patient's functional outcome.   REHAB POTENTIAL: Good  CLINICAL DECISION MAKING: Stable/uncomplicated  EVALUATION COMPLEXITY: Low   GOALS: Goals reviewed with patient? Yes  SHORT TERM GOALS: Target date: 03/28/2023   Independent in initial HEP Baseline: Goal status: INITIAL  2.  Wean from sling per MD protocol Baseline:  Goal status: INITIAL   LONG TERM GOALS: Target date: 04/24/2023   Improve posture and alignment with patient demonstrating improved upright posture with posterior shoulder girdle engaged  Baseline:  Goal status: INITIAL  2.  AROM Lt shoulder/elbow/forearm =/> than AROM Rt UE  Baseline:  Goal status: INITIAL  3.  4/5 to 5/5 strength Lt shoulder and elbow  Baseline:  Goal status: INITIAL  4.  Return to normal functional activities including return to work with no limitations of Lt UE Baseline:  Goal status: INITIAL  5.  Independent in HEP including aquatic program as indicated  Baseline:  Goal status: INITIAL  6.  Improve functional limitation score to 59 Baseline: 4 Goal status: INITIAL  PLAN:  PT FREQUENCY: 2x/week  PT DURATION: 8 weeks  PLANNED INTERVENTIONS: Therapeutic exercises, Therapeutic activity, Neuromuscular re-education, Patient/Family education, Self Care, Joint mobilization, Aquatic Therapy, Dry Needling, Electrical stimulation, Spinal mobilization,  Cryotherapy, Moist heat, Taping, Vasopneumatic device, Ultrasound, Ionotophoresis 4mg /ml Dexamethasone, Manual therapy, and Re-evaluation  PLAN FOR NEXT SESSION: review and progress exercise; continue with postural correction and education; modalities, manual work, DN as indicated    [employer], PT 02/28/2023, 12:17 PM

## 2023-02-28 ENCOUNTER — Ambulatory Visit
Payer: Managed Care, Other (non HMO) | Attending: Orthopedic Surgery | Admitting: Rehabilitative and Restorative Service Providers"

## 2023-02-28 ENCOUNTER — Encounter: Payer: Self-pay | Admitting: Rehabilitative and Restorative Service Providers"

## 2023-02-28 ENCOUNTER — Other Ambulatory Visit: Payer: Self-pay

## 2023-02-28 DIAGNOSIS — Z4789 Encounter for other orthopedic aftercare: Secondary | ICD-10-CM | POA: Diagnosis present

## 2023-02-28 DIAGNOSIS — M25512 Pain in left shoulder: Secondary | ICD-10-CM | POA: Diagnosis not present

## 2023-02-28 DIAGNOSIS — R293 Abnormal posture: Secondary | ICD-10-CM

## 2023-02-28 DIAGNOSIS — M25612 Stiffness of left shoulder, not elsewhere classified: Secondary | ICD-10-CM

## 2023-02-28 DIAGNOSIS — M6281 Muscle weakness (generalized): Secondary | ICD-10-CM | POA: Diagnosis not present

## 2023-03-07 ENCOUNTER — Encounter: Payer: Self-pay | Admitting: Rehabilitative and Restorative Service Providers"

## 2023-03-07 ENCOUNTER — Ambulatory Visit
Payer: Managed Care, Other (non HMO) | Attending: Orthopedic Surgery | Admitting: Rehabilitative and Restorative Service Providers"

## 2023-03-07 DIAGNOSIS — M25512 Pain in left shoulder: Secondary | ICD-10-CM | POA: Diagnosis present

## 2023-03-07 DIAGNOSIS — M6281 Muscle weakness (generalized): Secondary | ICD-10-CM | POA: Insufficient documentation

## 2023-03-07 DIAGNOSIS — M25612 Stiffness of left shoulder, not elsewhere classified: Secondary | ICD-10-CM | POA: Diagnosis present

## 2023-03-07 DIAGNOSIS — R293 Abnormal posture: Secondary | ICD-10-CM | POA: Diagnosis present

## 2023-03-07 NOTE — Therapy (Signed)
OUTPATIENT PHYSICAL THERAPY SHOULDER TREATMENT   Patient Name: Richard Long MRN: [id number] DOB:[birth year], 32 y.o., male Today's Date: 03/07/2023  END OF SESSION:  PT End of Session - 03/07/23 1104     Visit Number 2    Number of Visits 16    Date for PT Re-Evaluation 04/24/23    Authorization Type cigna    Authorization Time Period auth required after 5th visit    Authorization - Visit Number 2    Authorization - Number of Visits 20    PT Start Time [id number]    PT Stop Time 1150    PT Time Calculation (min) 48 min    Activity Tolerance Patient tolerated treatment well             Past Medical History:  Diagnosis Date   Hypertension    Thyroid disease    Past Surgical History:  Procedure Laterality Date   CYST REMOVAL HAND     Patient Active Problem List   Diagnosis Date Noted   Labral tear of shoulder, left, initial encounter 12/11/2022   Diarrhea 06/19/2022   Well adult exam 02/18/2022   Strain of left soleus muscle 08/11/2021   OSA (obstructive sleep apnea) 02/10/2020   Decreased libido 02/10/2020   Strain of right shoulder 10/20/2019   History of thyroid disorder 10/27/2018   Hypertension goal BP (blood pressure) < 130/80 10/27/2018   Acute pain of right shoulder 10/27/2018   Class 2 severe obesity due to excess calories with serious comorbidity in adult 10/27/2018   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   Vitamin D deficiency 10/07/2017   Chronic tension-type headache, not intractable 04/06/2016   Pure hypercholesterolemia 04/06/2016    PCP: Dr Luetta Nutting   REFERRING PROVIDER: Dr Justice Britain   REFERRING DIAG: Labral tear Lt shoulder   THERAPY DIAG:  Acute pain of left shoulder  Muscle weakness (generalized)  Abnormal posture  Stiffness of left shoulder, not elsewhere classified  Rationale for Evaluation and Treatment: Rehabilitation  ONSET DATE: 02/12/23  SUBJECTIVE:                                                                                                                                                                                       SUBJECTIVE STATEMENT: Patient reports that he reached out to catch his son when he was falling and did use the Lt UE. Arm was in the sling and he had no increase pain but may had increased swelling in the shoulder yesterday. Back to normal today. Still using ice at home.   Eval: Patient reports that he has had Lt shoulder pain for the past 6 months with increased  pain 1/24. MRI showed labral tear and biceps tendon tear. Underwent Lt shoulder score 02/12/23 with no post op complications.  Hand dominance: Ambidextrous; primarily Rt  PERTINENT HISTORY: Rt shoulder injury with labral repair and biceps tenodesis 2020; Lt knee injury/damage; anxiety; HTN  PAIN:  Are you having pain? Yes: NPRS scale: 0/10; no longer wakes in the AM in biceps pain but continues to have tightness and spasms briefly  Pain location: biceps  Pain description: gripping  Aggravating factors: lying flat  Relieving factors: propping   PRECAUTIONS: no heavy lifting overhead; no jerking movements; do not use Lt shoulder in sitting/rising; no isolated biceps for 8 weeks per MD protocol   WEIGHT BEARING RESTRICTIONS: No  FALLS:  Has patient fallen in last 6 months? 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 Superior posterior labral tear with an associated moderate-sized dissecting paralabral cyst back into the spinoglenoid notch. No findings to suggest compression of the suprascapular nerve. Buford complex variant. Mild rotator cuff tendinopathy/tendinosis with  small interstitial tears mainly involving the infraspinatus and supraspinatus tendons. No partial or full-thickness rotator cuff tear. Intact long head biceps tendon.Os acromiale.    PATIENT SURVEYS:  FOTO 4 target 11  POSTURE: Patient presents with head forward posture with increased thoracic kyphosis; shoulders rounded and elevated; scapulae abducted and rotated along the thoracic spine; head of the humerus anterior in orientation.   UPPER EXTREMITY ROM: AROM Rt shoulder assessed in standing; PROM assessed in supine   Active ROM Rt Passive ROM Lt  Right eval Left eval  Shoulder flexion 162 155  Shoulder extension 60 20  Shoulder abduction(in scapular plane) 162 150  Shoulder adduction    Shoulder internal rotation Thumb T7 UE resting at side   Shoulder external rotation 119 shoulder 90/elbow 90 degrees  30 degrees scapular plane Elbow flexed   Elbow flexion WNL   Elbow extension WNL   Wrist flexion WNL   Wrist extension WNL   Wrist ulnar deviation WNL   Wrist radial deviation WNL   Wrist pronation WNL   Wrist supination WNL   (Blank rows = not tested)  UPPER EXTREMITY MMT:  MMT Right eval Left eval  Shoulder flexion    Shoulder extension    Shoulder abduction    Shoulder adduction    Shoulder internal rotation    Shoulder external rotation    Middle trapezius    Lower trapezius    Elbow flexion    Elbow extension    Wrist flexion    Wrist extension    Wrist ulnar deviation    Wrist radial deviation    Wrist pronation    Wrist supination    Grip strength (lbs)    (Blank rows = not tested)  PALPATION:  Muscular tightness Lt pecs; upper trap; shoulder girdle musculature    [facility] Adult PT Treatment:  DATE: 03/07/23 Therapeutic Exercise: Supine: AAROM shoulder flexion assisting with Lt UE 2-3 sec pause x 10  Scap squeeze 10 sec x 10  Shoulder rhythmic stabilization - flexion/extension; horizontal ab/adduction;  circles CW/CCW 15 reps each  Prone:  Rowing w/ scap squeeze 2-3 sec x 15 Shoulder extension w/scap squeeze 23 sec x 15  Sidelying:  Shoulder ER w/scap squeeze 2-3 sec x 15 Standing  Pendulum 30CW/30 CCW Scap squeeze w/noodle 10 sec x 15 Chin tuck w/noodle x 10 sec  AAROM shoulder ER w/ cane elbow 90 deg 10 sec x 10  Shoulder flexion elbow flexed at counter 10 sec x 10  Wall slide Lt UE elbow flexed 3-5 sec x 5  Manual Therapy: PROM Lt shoulder pt supine shoulder flexion; scaption; ER/IR in scaption; extension within tissue limits with no pain  Neuromuscular re-ed: Working on posture and alignment in standing and sitting  Modalities: Has ice machine at home  Self Care: Reviewed post op precautions encouraging caution with exercises and avoiding use of Lt UE   [facility] Adult PT Treatment:                                                DATE: 02/28/23 Therapeutic Exercise: Standing  Pendulum 30CW/30 CCW Scap squeeze w/noodle 10 sec x 15 Chin tuck w/noodle x 10 sec  AAROM shoulder ER w/ cane elbow 90 deg 10 sec x 10  Shoulder flexion elbow flexed at counter 10 sec x 10  Wall slide Lt UE elbow flexed 3-5 sec x 5  Manual Therapy: PROM Lt shoulder pt supine shoulder flexion; scaption; ER/IR in scaption; extension within tissue limits with no pain  Neuromuscular re-ed: Working on posture and alignment in standing and sitting  Modalities: Has ice machine at home  Self Care: Reviewed post op precautions including no lifting; elbow flexion on greater than 45 deg      PATIENT EDUCATION: Education details: POC; HEP  Person educated: Patient Education method: Consulting civil engineer, Demonstration, Tactile cues, Verbal cues, and Handouts Education comprehension: verbalized understanding, returned demonstration, verbal cues required, tactile cues required, and needs further education  HOME EXERCISE PROGRAM:  Access Code: [email or web address] URL: [email or web address] Date: 03/07/2023 Prepared  by: Gillermo Murdoch  Exercises - Circular Shoulder Pendulum with Table Support  - 3-4 x daily - 7 x weekly - 1 sets - 20-30 reps - Seated Scapular Retraction  - 2 x daily - 7 x weekly - 1-2 sets - 10 reps - 10 sec  hold - Seated Cervical Retraction  - 2 x daily - 7 x weekly - 1-2 sets - 5-10 reps - 10 sec  hold - Seated Shoulder External Rotation AAROM with Cane and Hand in Neutral  - 2 x daily - 7 x weekly - 1 sets - 5-10 reps - 5-10 sec  hold - Standing 'L' Stretch at [employer]  - 2 x daily - 7 x weekly - 1 sets - 5 reps - 10 sec  hold - Supine Shoulder Flexion AAROM with Hands Clasped  - 2 x daily - 7 x weekly - 1 sets - 5-10 reps - 2-3 sec  hold - Supine Shoulder Rhythmic Stabilization- Flexion/Extension  - 2 x daily - 7 x weekly - 1 sets - 10-15 reps - Prone Shoulder Row  - 2 x daily - 7 x weekly - 1-2 sets - 10  reps - 3 sec  hold - Prone Shoulder Extension - Single Arm  - 2 x daily - 7 x weekly - 1 sets - 10-15 reps - 3 sec  hold - Sidelying Shoulder External Rotation  - 2 x daily - 7 x weekly - 1 sets - 10-15 reps - 3 sec  hold  ASSESSMENT:  CLINICAL IMPRESSION: Patient demonstrated exercises without difficulty, adding exercises per MD protocol. Continued with manual work including joint mobilization and PROM/stretching within tissue limits and per protocol. He is s/p Lt shoulder surgery for labral repair and biceps tenodesis 02/12/23.     OBJECTIVE IMPAIRMENTS: poor posture and alignment; limited Lt shoulder ROM, strength and functional activity. He has minimal pain. Sleeping is difficult when out of the recliner. Patient is unable to work or use Lt UE for functional and recreational activities, decreased activity tolerance, decreased mobility, decreased ROM, decreased strength, hypomobility, increased fascial restrictions, impaired flexibility, impaired sensation, impaired UE functional use, improper body mechanics, postural dysfunction, and pain.    GOALS: Goals reviewed with patient?  Yes  SHORT TERM GOALS: Target date: 03/28/2023   Independent in initial HEP Baseline: Goal status: INITIAL  2.  Wean from sling per MD protocol Baseline:  Goal status: INITIAL   LONG TERM GOALS: Target date: 04/24/2023   Improve posture and alignment with patient demonstrating improved upright posture with posterior shoulder girdle engaged  Baseline:  Goal status: INITIAL  2.  AROM Lt shoulder/elbow/forearm =/> than AROM Rt UE  Baseline:  Goal status: INITIAL  3.  4/5 to 5/5 strength Lt shoulder and elbow  Baseline:  Goal status: INITIAL  4.  Return to normal functional activities including return to work with no limitations of Lt UE Baseline:  Goal status: INITIAL  5.  Independent in HEP including aquatic program as indicated  Baseline:  Goal status: INITIAL  6.  Improve functional limitation score to 59 Baseline: 4 Goal status: INITIAL  PLAN:  PT FREQUENCY: 2x/week  PT DURATION: 8 weeks  PLANNED INTERVENTIONS: Therapeutic exercises, Therapeutic activity, Neuromuscular re-education, Patient/Family education, Self Care, Joint mobilization, Aquatic Therapy, Dry Needling, Electrical stimulation, Spinal mobilization, Cryotherapy, Moist heat, Taping, Vasopneumatic device, Ultrasound, Ionotophoresis 4mg /ml Dexamethasone, Manual therapy, and Re-evaluation  PLAN FOR NEXT SESSION: review and progress exercise; continue with postural correction and education; modalities, manual work, DN as indicated    [employer], PT 03/07/2023, 11:04 AM

## 2023-03-14 ENCOUNTER — Ambulatory Visit: Payer: Managed Care, Other (non HMO) | Admitting: Rehabilitative and Restorative Service Providers"

## 2023-03-14 ENCOUNTER — Encounter: Payer: Self-pay | Admitting: Rehabilitative and Restorative Service Providers"

## 2023-03-14 DIAGNOSIS — M25512 Pain in left shoulder: Secondary | ICD-10-CM | POA: Diagnosis not present

## 2023-03-14 DIAGNOSIS — M6281 Muscle weakness (generalized): Secondary | ICD-10-CM

## 2023-03-14 DIAGNOSIS — M25612 Stiffness of left shoulder, not elsewhere classified: Secondary | ICD-10-CM

## 2023-03-14 DIAGNOSIS — R293 Abnormal posture: Secondary | ICD-10-CM

## 2023-03-14 NOTE — Therapy (Signed)
OUTPATIENT PHYSICAL THERAPY SHOULDER TREATMENT   Patient Name: Richard Long MRN: [id number] DOB:[birth year], 32 y.o., male Today's Date: 03/14/2023  END OF SESSION:  PT End of Session - 03/14/23 1101     Visit Number 3    Number of Visits 16    Date for PT Re-Evaluation 04/24/23    Authorization Type cigna    Authorization Time Period auth required after 5th visit    Authorization - Visit Number 3    Authorization - Number of Visits 20    PT Start Time 1100    PT Stop Time 1148    PT Time Calculation (min) 48 min    Activity Tolerance Patient tolerated treatment well             Past Medical History:  Diagnosis Date   Hypertension    Thyroid disease    Past Surgical History:  Procedure Laterality Date   CYST REMOVAL HAND     Patient Active Problem List   Diagnosis Date Noted   Labral tear of shoulder, left, initial encounter 12/11/2022   Diarrhea 06/19/2022   Well adult exam 02/18/2022   Strain of left soleus muscle 08/11/2021   OSA (obstructive sleep apnea) 02/10/2020   Decreased libido 02/10/2020   Strain of right shoulder 10/20/2019   History of thyroid disorder 10/27/2018   Hypertension goal BP (blood pressure) < 130/80 10/27/2018   Acute pain of right shoulder 10/27/2018   Class 2 severe obesity due to excess calories with serious comorbidity in adult 10/27/2018   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   Vitamin D deficiency 10/07/2017   Chronic tension-type headache, not intractable 04/06/2016   Pure hypercholesterolemia 04/06/2016    PCP: Dr [name]   REFERRING PROVIDER: Dr Francena Hanly   REFERRING DIAG: Labral tear Lt shoulder   THERAPY DIAG:  Acute pain of left shoulder  Muscle weakness (generalized)  Abnormal posture  Stiffness of left shoulder, not elsewhere classified  Rationale for Evaluation and Treatment: Rehabilitation  ONSET DATE: 02/12/23  SUBJECTIVE:                                                                                                                                                                                       SUBJECTIVE STATEMENT: Patient reports that his shoulder is doing well. He has some stiffness in the mornings and some soreness in the arm at times. Still using ice at home.   Eval: Patient reports that he has had Lt shoulder pain for the past 6 months with increased pain 1/24. MRI showed labral tear and biceps tendon tear. Underwent Lt shoulder score 02/12/23 with no post op complications.  Hand dominance: Ambidextrous; primarily Rt  PERTINENT HISTORY: Rt shoulder injury with labral repair and biceps tenodesis 2020; Lt knee injury/damage; anxiety; HTN  PAIN:  Are you having pain? Yes: NPRS scale: 0/10; no longer wakes in the AM in biceps pain but continues to have tightness and spasms briefly  Pain location: biceps  Pain description: gripping  Aggravating factors: lying flat  Relieving factors: propping   PRECAUTIONS: no heavy lifting overhead; no jerking movements; do not use Lt shoulder in sitting/rising; no isolated biceps for 8 weeks per MD protocol   WEIGHT BEARING RESTRICTIONS: No  FALLS:  Has patient fallen in last 6 months? 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 Superior posterior labral tear with an associated moderate-sized dissecting paralabral cyst back into the spinoglenoid notch. No findings to suggest compression of the suprascapular nerve. Buford complex variant. Mild rotator cuff tendinopathy/tendinosis with small interstitial tears mainly involving the infraspinatus and supraspinatus tendons. No partial  or full-thickness rotator cuff tear. Intact long head biceps tendon.Os acromiale.    PATIENT SURVEYS:  FOTO 4 target 59  POSTURE: Patient presents with head forward posture with increased thoracic kyphosis; shoulders rounded and elevated; scapulae abducted and rotated along the thoracic spine; head of the humerus anterior in orientation.   UPPER EXTREMITY ROM: AROM Rt shoulder assessed in standing; PROM assessed in supine   Active ROM Rt Passive ROM Lt  Right eval Left eval  Shoulder flexion 162 155  Shoulder extension 60 20  Shoulder abduction(in scapular plane) 162 150  Shoulder adduction    Shoulder internal rotation Thumb T7 UE resting at side   Shoulder external rotation 119 shoulder 90/elbow 90 degrees  30 degrees scapular plane Elbow flexed   Elbow flexion WNL   Elbow extension WNL   Wrist flexion WNL   Wrist extension WNL   Wrist ulnar deviation WNL   Wrist radial deviation WNL   Wrist pronation WNL   Wrist supination WNL   (Blank rows = not tested)  UPPER EXTREMITY MMT:  MMT Right eval Left eval  Shoulder flexion    Shoulder extension    Shoulder abduction    Shoulder adduction    Shoulder internal rotation    Shoulder external rotation    Middle trapezius    Lower trapezius    Elbow flexion    Elbow extension    Wrist flexion    Wrist extension    Wrist ulnar deviation    Wrist radial deviation    Wrist pronation    Wrist supination    Grip strength (lbs)    (Blank rows = not tested)  PALPATION:  Muscular tightness Lt pecs; upper trap; shoulder girdle musculature    [facility] Adult PT Treatment:                                                DATE: 03/14/23 Therapeutic Exercise: Supine: AAROM shoulder flexion assisting with Lt UE 2-3 sec pause x 10  Scap squeeze  10 sec x 10  Shoulder rhythmic stabilization - flexion/extension; horizontal ab/adduction; circles CW/CCW 15 reps each  Prone:  Rowing w/ scap squeeze 2-3 sec x 15 Shoulder extension  w/scap squeeze 2-3 sec x 15  Sidelying:  Shoulder ER w/scap squeeze 2-3 sec x 15 Standing  Pendulum 30CW/30 CCW Scap squeeze w/noodle 10 sec x 15 Chin tuck w/noodle x 10 sec  AAROM shoulder ER w/ cane elbow 90 deg 10 sec x 10  Shoulder flexion elbow flexed at counter 10 sec x 10  Wall slide Lt UE elbow flexed 3-5 sec x 5  Manual Therapy: PROM Lt shoulder pt supine shoulder flexion; scaption; ER/IR in scaption; extension within tissue limits with no pain  Neuromuscular re-ed: Working on posture and alignment in standing and sitting  Modalities: Has ice machine at home  Self Care: Reviewed post op precautions encouraging caution with exercises and avoiding use of Lt UE   [facility] Adult PT Treatment:                                                DATE: 03/07/23 Therapeutic Exercise: Supine: AAROM shoulder flexion assisting with Lt UE 2-3 sec pause x 10  Scap squeeze 10 sec x 10  Shoulder rhythmic stabilization - flexion/extension; horizontal ab/adduction; circles CW/CCW 15 reps each  Prone:  Rowing w/ scap squeeze 2-3 sec x 15 Shoulder extension w/scap squeeze 23 sec x 15  Sidelying:  Shoulder ER w/scap squeeze 2-3 sec x 15 Standing  Pendulum 30CW/30 CCW Scap squeeze w/noodle 10 sec x 15 Chin tuck w/noodle x 10 sec  AAROM shoulder ER w/ cane elbow 90 deg 10 sec x 10  Shoulder flexion elbow flexed at counter 10 sec x 10  Wall slide Lt UE elbow flexed 3-5 sec x 5  Manual Therapy: PROM Lt shoulder pt supine shoulder flexion; scaption; ER/IR in scaption; extension within tissue limits with no pain  Neuromuscular re-ed: Working on posture and alignment in standing and sitting  Modalities: Has ice machine at home  Self Care: Reviewed post op precautions encouraging caution with exercises and avoiding use of Lt UE    PATIENT EDUCATION: Education details: POC; HEP  Person educated: Patient Education method: Explanation, Demonstration, Tactile cues, Verbal cues, and  Handouts Education comprehension: verbalized understanding, returned demonstration, verbal cues required, tactile cues required, and needs further education  HOME EXERCISE PROGRAM:  Access Code: [email or web address] URL: [email or web address] Date: 03/14/2023 Prepared by: Corlis Leakelyn Demetris Meinhardt  Exercises - Circular Shoulder Pendulum with Table Support  - 3-4 x daily - 7 x weekly - 1 sets - 20-30 reps - Seated Scapular Retraction  - 2 x daily - 7 x weekly - 1-2 sets - 10 reps - 10 sec  hold - Seated Cervical Retraction  - 2 x daily - 7 x weekly - 1-2 sets - 5-10 reps - 10 sec  hold - Seated Shoulder External Rotation AAROM with Cane and Hand in Neutral  - 2 x daily - 7 x weekly - 1 sets - 5-10 reps - 5-10 sec  hold - Standing 'L' Stretch at [employer]  - 2 x daily - 7 x weekly - 1 sets - 5 reps - 10 sec  hold - Supine Shoulder Flexion AAROM with Hands Clasped  - 2 x daily - 7 x weekly - 1 sets - 5-10 reps - 2-3 sec  hold - Supine Shoulder Rhythmic Stabilization- Flexion/Extension  - 2 x daily - 7 x weekly - 1 sets - 10-15 reps - Prone Shoulder Row  - 2 x daily - 7 x weekly - 1-2 sets - 10 reps - 3 sec  hold - Prone Shoulder Extension - Single Arm  - 2 x daily - 7 x weekly - 1 sets - 10-15 reps - 3 sec  hold - Sidelying Shoulder External Rotation  - 2 x daily - 7 x weekly - 1 sets - 10-15 reps - 3 sec  hold - Standing Isometric Shoulder Extension with Doorway - Arm Bent  - 2 x daily - 7 x weekly - 1 sets - 5-10 reps - 5 sec  hold - Isometric Shoulder Abduction at Wall  - 2 x daily - 7 x weekly - 1 sets - 5-10 reps - 5 sec  hold - Isometric Shoulder External Rotation at Wall  - 2 x daily - 7 x weekly - 1 sets - 5 reps - 5 sec  hold - Standing Isometric Shoulder Internal Rotation with Towel Roll at Doorway  - 2 x daily - 7 x weekly - 1 sets - 5 reps - 5 sec  hold  ASSESSMENT:  CLINICAL IMPRESSION: Patient demonstrated exercises without difficulty. Continued with manual work including joint mobilization  and PROM/stretching within tissue limits and per protocol.  Added isometric shoulder ext/abd/ER/IR. He is s/p Lt shoulder surgery for labral repair and biceps tenodesis 02/12/23.     OBJECTIVE IMPAIRMENTS: poor posture and alignment; limited Lt shoulder ROM, strength and functional activity. He has minimal pain. Sleeping is difficult when out of the recliner. Patient is unable to work or use Lt UE for functional and recreational activities, decreased activity tolerance, decreased mobility, decreased ROM, decreased strength, hypomobility, increased fascial restrictions, impaired flexibility, impaired sensation, impaired UE functional use, improper body mechanics, postural dysfunction, and pain.    GOALS: Goals reviewed with patient? Yes  SHORT TERM GOALS: Target date: 03/28/2023   Independent in initial HEP Baseline: Goal status: INITIAL  2.  Wean from sling per MD protocol Baseline:  Goal status: INITIAL   LONG TERM GOALS: Target date: 04/24/2023   Improve posture and alignment with patient demonstrating improved upright posture with posterior shoulder girdle engaged  Baseline:  Goal status: INITIAL  2.  AROM Lt shoulder/elbow/forearm =/> than AROM Rt UE  Baseline:  Goal status: INITIAL  3.  4/5 to 5/5 strength Lt shoulder and elbow  Baseline:  Goal status: INITIAL  4.  Return to normal functional activities including return to work with no limitations of Lt UE Baseline:  Goal status: INITIAL  5.  Independent in HEP including aquatic program as indicated  Baseline:  Goal status: INITIAL  6.  Improve functional limitation score to 59 Baseline: 4 Goal status: INITIAL  PLAN:  PT FREQUENCY: 2x/week  PT DURATION: 8 weeks  PLANNED INTERVENTIONS: Therapeutic exercises, Therapeutic activity, Neuromuscular re-education, Patient/Family education, Self Care, Joint mobilization, Aquatic Therapy, Dry Needling, Electrical stimulation, Spinal mobilization, Cryotherapy, Moist heat,  Taping, Vasopneumatic device, Ultrasound, Ionotophoresis 4mg /ml Dexamethasone, Manual therapy, and Re-evaluation  PLAN FOR NEXT SESSION: review and progress exercise; continue with postural correction and education; modalities, manual work, DN as indicated    [employer], PT 03/14/2023, 11:02 AM

## 2023-03-18 ENCOUNTER — Encounter: Payer: Self-pay | Admitting: *Deleted

## 2023-03-19 ENCOUNTER — Ambulatory Visit: Payer: Managed Care, Other (non HMO) | Admitting: Rehabilitative and Restorative Service Providers"

## 2023-03-19 ENCOUNTER — Encounter: Payer: Self-pay | Admitting: Family Medicine

## 2023-03-19 ENCOUNTER — Encounter: Payer: Self-pay | Admitting: Rehabilitative and Restorative Service Providers"

## 2023-03-19 DIAGNOSIS — E7800 Pure hypercholesterolemia, unspecified: Secondary | ICD-10-CM

## 2023-03-19 DIAGNOSIS — M6281 Muscle weakness (generalized): Secondary | ICD-10-CM

## 2023-03-19 DIAGNOSIS — M25512 Pain in left shoulder: Secondary | ICD-10-CM | POA: Diagnosis not present

## 2023-03-19 DIAGNOSIS — M25612 Stiffness of left shoulder, not elsewhere classified: Secondary | ICD-10-CM

## 2023-03-19 DIAGNOSIS — R293 Abnormal posture: Secondary | ICD-10-CM

## 2023-03-19 DIAGNOSIS — Z0000 Encounter for general adult medical examination without abnormal findings: Secondary | ICD-10-CM

## 2023-03-19 NOTE — Telephone Encounter (Signed)
Lab orders signed.  ? ?Thanks! ? ?CM

## 2023-03-19 NOTE — Therapy (Signed)
OUTPATIENT PHYSICAL THERAPY SHOULDER TREATMENT   Patient Name: Richard Long MRN: [id number] DOB:[birth year], 32 y.o., male Today's Date: 03/19/2023  END OF SESSION:  PT End of Session - 03/19/23 1105     Visit Number 4    Number of Visits 16    Date for PT Re-Evaluation 04/24/23    Authorization Type cigna    Authorization Time Period auth required after 5th visit    Authorization - Visit Number 4    Authorization - Number of Visits 20    PT Start Time 1101    PT Stop Time 1145    PT Time Calculation (min) 44 min    Activity Tolerance Patient tolerated treatment well             Past Medical History:  Diagnosis Date   Hypertension    Thyroid disease    Past Surgical History:  Procedure Laterality Date   CYST REMOVAL HAND     Patient Active Problem List   Diagnosis Date Noted   Labral tear of shoulder, left, initial encounter 12/11/2022   Diarrhea 06/19/2022   Well adult exam 02/18/2022   Strain of left soleus muscle 08/11/2021   OSA (obstructive sleep apnea) 02/10/2020   Decreased libido 02/10/2020   Strain of right shoulder 10/20/2019   History of thyroid disorder 10/27/2018   Hypertension goal BP (blood pressure) < 130/80 10/27/2018   Acute pain of right shoulder 10/27/2018   Class 2 severe obesity due to excess calories with serious comorbidity in adult 10/27/2018   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   Vitamin D deficiency 10/07/2017   Chronic tension-type headache, not intractable 04/06/2016   Pure hypercholesterolemia 04/06/2016    PCP: Dr [name]   REFERRING PROVIDER: Dr Francena Hanly   REFERRING DIAG: Labral tear Lt shoulder   THERAPY DIAG:  Acute pain of left shoulder  Muscle weakness (generalized)  Abnormal posture  Stiffness of left shoulder, not elsewhere classified  Rationale for Evaluation and Treatment: Rehabilitation  ONSET DATE: 02/12/23  SUBJECTIVE:                                                                                                                                                                                       SUBJECTIVE STATEMENT: Patient reports that his shoulder is doing ok. He has some discomfort with his arm hanging at side. He has some stiffness in the mornings and some soreness in the arm at times. Working on exercises at home and using ice infrequently.   Eval: Patient reports that he has had Lt shoulder pain for the past 6 months with increased pain 1/24. MRI showed labral tear and  biceps tendon tear. Underwent Lt shoulder score 02/12/23 with no post op complications.  Hand dominance: Ambidextrous; primarily Rt  PERTINENT HISTORY: Rt shoulder injury with labral repair and biceps tenodesis 2020; Lt knee injury/damage; anxiety; HTN  PAIN:  Are you having pain? Yes: NPRS scale: 0/10; no longer wakes in the AM in biceps pain but continues to have tightness and spasms briefly  Pain location: biceps  Pain description: gripping  Aggravating factors: lying flat  Relieving factors: propping   PRECAUTIONS: no heavy lifting overhead; no jerking movements; do not use Lt shoulder in sitting/rising; no isolated biceps for 8 weeks per MD protocol   WEIGHT BEARING RESTRICTIONS: No  FALLS:  Has patient fallen in last 6 months? 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 Superior posterior labral tear with an associated moderate-sized dissecting paralabral cyst back into the spinoglenoid notch. No findings to suggest compression of the suprascapular nerve. Buford complex variant. Mild rotator cuff tendinopathy/tendinosis with small  interstitial tears mainly involving the infraspinatus and supraspinatus tendons. No partial or full-thickness rotator cuff tear. Intact long head biceps tendon.Os acromiale.    PATIENT SURVEYS:  FOTO 4 target 59  POSTURE: Patient presents with head forward posture with increased thoracic kyphosis; shoulders rounded and elevated; scapulae abducted and rotated along the thoracic spine; head of the humerus anterior in orientation.   UPPER EXTREMITY ROM: AROM Rt shoulder assessed in standing; PROM assessed in supine   Active ROM Rt Passive ROM Lt  Right eval Left eval Left  03/19/23  Shoulder flexion 162 155 173  Shoulder extension 60 20 64  Shoulder abduction(in scapular plane) 162 150 170  Shoulder adduction     Shoulder internal rotation Thumb T7 UE resting at side  60 in scapular plane   Shoulder external rotation 119 shoulder 90/elbow 90 degrees  30 degrees scapular plane Elbow flexed  76  In scapular plane elbow flexed   Elbow flexion WNL  138  Elbow extension WNL  -3  Wrist flexion WNL    Wrist extension WNL    Wrist ulnar deviation WNL    Wrist radial deviation WNL    Wrist pronation WNL    Wrist supination WNL    (Blank rows = not tested)  UPPER EXTREMITY MMT:  MMT Right eval Left eval  Shoulder flexion    Shoulder extension    Shoulder abduction    Shoulder adduction    Shoulder internal rotation    Shoulder external rotation    Middle trapezius    Lower trapezius    Elbow flexion    Elbow extension    Wrist flexion    Wrist extension    Wrist ulnar deviation    Wrist radial deviation    Wrist pronation    Wrist supination    Grip strength (lbs)    (Blank rows = not tested)  PALPATION:  Muscular tightness Lt pecs; upper trap; shoulder girdle musculature    [facility] Adult PT Treatment:  DATE: 03/19/23 Therapeutic Exercise: Supine: AAROM shoulder flexion assisting with Lt UE 2-3 sec pause x 10  Scap squeeze  10 sec x 10  Shoulder rhythmic stabilization - flexion/extension; horizontal ab/adduction; circles CW/CCW 15 reps each  Prone:  Rowing w/ scap squeeze 2-3 sec x 15 Shoulder extension w/scap squeeze 2-3 sec x 15  Sidelying:  Shoulder ER w/scap squeeze 2-3 sec x 15 Standing  Pendulum 30CW/30 CCW Scap squeeze w/noodle 10 sec x 15 Chin tuck w/noodle x 10 sec  AAROM shoulder ER w/ cane elbow 90 deg 10 sec x 10  Shoulder flexion elbow flexed at counter 10 sec x 10  Wall slide Lt UE elbow flexed 3-5 sec x 5  Isometric shoulder abduction; extension; ER 5 sec x 5  Isometric row blue TB 5 sec x 10  Isometric ER green TB 5 sec x 5 Isometric IR green TB 5 sec x 5  Manual Therapy: PROM Lt shoulder pt supine shoulder flexion; scaption; ER/IR in scaption; extension within tissue limits with no pain  Neuromuscular re-ed: Working on posture and alignment in standing and sitting  Modalities: Has ice machine at home  Self Care: Reviewed post op precautions encouraging caution with exercises and avoiding use of Lt UE   [facility] Adult PT Treatment:                                                DATE: 03/14/23 Therapeutic Exercise: Supine: AAROM shoulder flexion assisting with Lt UE 2-3 sec pause x 10  Scap squeeze 10 sec x 10  Shoulder rhythmic stabilization - flexion/extension; horizontal ab/adduction; circles CW/CCW 15 reps each  Prone:  Rowing w/ scap squeeze 2-3 sec x 15 Shoulder extension w/scap squeeze 2-3 sec x 15  Sidelying:  Shoulder ER w/scap squeeze 2-3 sec x 15 Standing  Pendulum 30CW/30 CCW Scap squeeze w/noodle 10 sec x 15 Chin tuck w/noodle x 10 sec  AAROM shoulder ER w/ cane elbow 90 deg 10 sec x 10  Shoulder flexion elbow flexed at counter 10 sec x 10  Wall slide Lt UE elbow flexed 3-5 sec x 5  Manual Therapy: PROM Lt shoulder pt supine shoulder flexion; scaption; ER/IR in scaption; extension within tissue limits with no pain  Neuromuscular re-ed: Working on posture and  alignment in standing and sitting  Modalities: Has ice machine at home  Self Care: Reviewed post op precautions encouraging caution with exercises and avoiding use of Lt UE   [facility] Adult PT Treatment:                                                DATE: 03/07/23 Therapeutic Exercise: Supine: AAROM shoulder flexion assisting with Lt UE 2-3 sec pause x 10  Scap squeeze 10 sec x 10  Shoulder rhythmic stabilization - flexion/extension; horizontal ab/adduction; circles CW/CCW 15 reps each  Prone:  Rowing w/ scap squeeze 2-3 sec x 15 Shoulder extension w/scap squeeze 23 sec x 15  Sidelying:  Shoulder ER w/scap squeeze 2-3 sec x 15 Standing  Pendulum 30CW/30 CCW Scap squeeze w/noodle 10 sec x 15 Chin tuck w/noodle x 10 sec  AAROM shoulder ER w/ cane elbow 90 deg 10 sec x 10  Shoulder flexion elbow  flexed at counter 10 sec x 10  Wall slide Lt UE elbow flexed 3-5 sec x 5  Manual Therapy: PROM Lt shoulder pt supine shoulder flexion; scaption; ER/IR in scaption; extension within tissue limits with no pain  Neuromuscular re-ed: Working on posture and alignment in standing and sitting  Modalities: Has ice machine at home  Self Care: Reviewed post op precautions encouraging caution with exercises and avoiding use of Lt UE    PATIENT EDUCATION: Education details: POC; HEP  Person educated: Patient Education method: Explanation, Demonstration, Tactile cues, Verbal cues, and Handouts Education comprehension: verbalized understanding, returned demonstration, verbal cues required, tactile cues required, and needs further education  HOME EXERCISE PROGRAM:  Access Code: [email or web address] URL: [email or web address] Date: 03/19/2023 Prepared by: Corlis Leak  Exercises - Circular Shoulder Pendulum with Table Support  - 3-4 x daily - 7 x weekly - 1 sets - 20-30 reps - Seated Scapular Retraction  - 2 x daily - 7 x weekly - 1-2 sets - 10 reps - 10 sec  hold - Seated Cervical Retraction  - 2 x  daily - 7 x weekly - 1-2 sets - 5-10 reps - 10 sec  hold - Seated Shoulder External Rotation AAROM with Cane and Hand in Neutral  - 2 x daily - 7 x weekly - 1 sets - 5-10 reps - 5-10 sec  hold - Standing 'L' Stretch at [employer]  - 2 x daily - 7 x weekly - 1 sets - 5 reps - 10 sec  hold - Supine Shoulder Flexion AAROM with Hands Clasped  - 2 x daily - 7 x weekly - 1 sets - 5-10 reps - 2-3 sec  hold - Supine Shoulder Rhythmic Stabilization- Flexion/Extension  - 2 x daily - 7 x weekly - 1 sets - 10-15 reps - Prone Shoulder Row  - 2 x daily - 7 x weekly - 1-2 sets - 10 reps - 3 sec  hold - Prone Shoulder Extension - Single Arm  - 2 x daily - 7 x weekly - 1 sets - 10-15 reps - 3 sec  hold - Sidelying Shoulder External Rotation  - 2 x daily - 7 x weekly - 1 sets - 10-15 reps - 3 sec  hold - Standing Isometric Shoulder Extension with Doorway - Arm Bent  - 2 x daily - 7 x weekly - 1 sets - 5-10 reps - 5 sec  hold - Isometric Shoulder Abduction at Wall  - 2 x daily - 7 x weekly - 1 sets - 5-10 reps - 5 sec  hold - Isometric Shoulder External Rotation at Wall  - 2 x daily - 7 x weekly - 1 sets - 5 reps - 5 sec  hold - Standing Isometric Shoulder Internal Rotation with Towel Roll at Doorway  - 2 x daily - 7 x weekly - 1 sets - 5 reps - 5 sec  hold - Standing Shoulder Row Reactive Isometric  - 2 x daily - 7 x weekly - 1 sets - 10 reps - 30-45 sec  hold - Shoulder External Rotation Reactive Isometrics  - 1 x daily - 7 x weekly - 1 sets - 5-10 reps - 10-30 sec  hold - Shoulder Internal Rotation Reactive Isometrics  - 2 x daily - 7 x weekly - 1 sets - 10 reps - 3-5 sec  hold  ASSESSMENT:  CLINICAL IMPRESSION: Patient demonstrates exercises without difficulty. Continued with manual work including joint mobilization and PROM/stretching within tissue limits  and per protocol.  Added isometric shoulder ext/abd/ER/IR. He is s/p Lt shoulder surgery for labral repair and biceps tenodesis 02/12/23.     OBJECTIVE  IMPAIRMENTS: poor posture and alignment; limited Lt shoulder ROM, strength and functional activity. He has minimal pain. Sleeping is difficult when out of the recliner. Patient is unable to work or use Lt UE for functional and recreational activities, decreased activity tolerance, decreased mobility, decreased ROM, decreased strength, hypomobility, increased fascial restrictions, impaired flexibility, impaired sensation, impaired UE functional use, improper body mechanics, postural dysfunction, and pain.    GOALS: Goals reviewed with patient? Yes  SHORT TERM GOALS: Target date: 03/28/2023   Independent in initial HEP Baseline: Goal status: INITIAL  2.  Wean from sling per MD protocol Baseline:  Goal status: INITIAL   LONG TERM GOALS: Target date: 04/24/2023   Improve posture and alignment with patient demonstrating improved upright posture with posterior shoulder girdle engaged  Baseline:  Goal status: INITIAL  2.  AROM Lt shoulder/elbow/forearm =/> than AROM Rt UE  Baseline:  Goal status: INITIAL  3.  4/5 to 5/5 strength Lt shoulder and elbow  Baseline:  Goal status: INITIAL  4.  Return to normal functional activities including return to work with no limitations of Lt UE Baseline:  Goal status: INITIAL  5.  Independent in HEP including aquatic program as indicated  Baseline:  Goal status: INITIAL  6.  Improve functional limitation score to 59 Baseline: 4 Goal status: INITIAL  PLAN:  PT FREQUENCY: 2x/week  PT DURATION: 8 weeks  PLANNED INTERVENTIONS: Therapeutic exercises, Therapeutic activity, Neuromuscular re-education, Patient/Family education, Self Care, Joint mobilization, Aquatic Therapy, Dry Needling, Electrical stimulation, Spinal mobilization, Cryotherapy, Moist heat, Taping, Vasopneumatic device, Ultrasound, Ionotophoresis /ml Dexamethasone, Manual therapy, and Re-evaluation  PLAN FOR NEXT SESSION: review and progress exercise; continue with postural  correction and education; modalities, manual work, DN as indicated    [employer], PT 03/19/2023, 11:05 AM

## 2023-03-25 ENCOUNTER — Encounter: Payer: Managed Care, Other (non HMO) | Admitting: Family Medicine

## 2023-03-26 ENCOUNTER — Ambulatory Visit: Payer: Managed Care, Other (non HMO) | Admitting: Rehabilitative and Restorative Service Providers"

## 2023-03-26 ENCOUNTER — Encounter: Payer: Self-pay | Admitting: Rehabilitative and Restorative Service Providers"

## 2023-03-26 DIAGNOSIS — M25512 Pain in left shoulder: Secondary | ICD-10-CM | POA: Diagnosis not present

## 2023-03-26 DIAGNOSIS — R293 Abnormal posture: Secondary | ICD-10-CM

## 2023-03-26 DIAGNOSIS — M6281 Muscle weakness (generalized): Secondary | ICD-10-CM

## 2023-03-26 DIAGNOSIS — M25612 Stiffness of left shoulder, not elsewhere classified: Secondary | ICD-10-CM

## 2023-03-26 NOTE — Therapy (Signed)
OUTPATIENT PHYSICAL THERAPY SHOULDER TREATMENT   Patient Name: Richard Long MRN: [id number] DOB:1991/08/20, 32 y.o., male Today's Date: 03/26/2023  END OF SESSION:  PT End of Session - 03/26/23 1357     Visit Number 5    Number of Visits 16    Date for PT Re-Evaluation 04/24/23    Authorization Type cigna    Authorization Time Period auth required after 5th visit    Authorization - Visit Number 5    Authorization - Number of Visits 20    PT Start Time 1356    PT Stop Time 1442    PT Time Calculation (min) 46 min    Activity Tolerance Patient tolerated treatment well             Past Medical History:  Diagnosis Date   Hypertension    Thyroid disease    Past Surgical History:  Procedure Laterality Date   CYST REMOVAL HAND     Patient Active Problem List   Diagnosis Date Noted   Labral tear of shoulder, left, initial encounter 12/11/2022   Diarrhea 06/19/2022   Well adult exam 02/18/2022   Strain of left soleus muscle 08/11/2021   OSA (obstructive sleep apnea) 02/10/2020   Decreased libido 02/10/2020   Strain of right shoulder 10/20/2019   History of thyroid disorder 10/27/2018   Hypertension goal BP (blood pressure) < 130/80 10/27/2018   Acute pain of right shoulder 10/27/2018   Class 2 severe obesity due to excess calories with serious comorbidity in adult 10/27/2018   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   Vitamin D deficiency 10/07/2017   Chronic tension-type headache, not intractable 04/06/2016   Pure hypercholesterolemia 04/06/2016    PCP: Dr [name]   REFERRING PROVIDER: Dr Francena Hanly   REFERRING DIAG: Labral tear Lt shoulder   THERAPY DIAG:  Acute pain of left shoulder  Muscle weakness (generalized)  Abnormal posture  Stiffness of left shoulder, not elsewhere classified  Rationale for Evaluation and Treatment: Rehabilitation  ONSET DATE: 02/12/23  SUBJECTIVE:                                                                                                                                                                                       SUBJECTIVE STATEMENT: Patient saw MD and is out of sling. MD pleased with progress and OK'ed progression of shoulder strengthening - just don't load the biceps too much. [name] reports that his shoulder is doing ok with some discomfort. He feels that his elbow motion has returned to normal, just stiff. He has some stiffness in the mornings and some soreness in the arm at times. Working on exercises at  home and using ice infrequently.   Eval: Patient reports that he has had Lt shoulder pain for the past 6 months with increased pain 1/24. MRI showed labral tear and biceps tendon tear. Underwent Lt shoulder score 02/12/23 with no post op complications.  Hand dominance: Ambidextrous; primarily Rt  PERTINENT HISTORY: Rt shoulder injury with labral repair and biceps tenodesis 2020; Lt knee injury/damage; anxiety; HTN  PAIN:  Are you having pain? Yes: NPRS scale: 0/10; no longer wakes in the AM in biceps pain but continues to have tightness and spasms briefly  Pain location: biceps  Pain description: gripping  Aggravating factors: lying flat  Relieving factors: propping   PRECAUTIONS: no heavy lifting overhead; no jerking movements; do not use Lt shoulder in sitting/rising; no isolated biceps for 8 weeks per MD protocol   WEIGHT BEARING RESTRICTIONS: No  FALLS:  Has patient fallen in last 6 months? 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 Superior posterior labral tear with an associated moderate-sized dissecting paralabral cyst  back into the spinoglenoid notch. No findings to suggest compression of the suprascapular nerve. Buford complex variant. Mild rotator cuff tendinopathy/tendinosis with small interstitial tears mainly involving the infraspinatus and supraspinatus tendons. No partial or full-thickness rotator cuff tear. Intact long head biceps tendon.Os acromiale.    PATIENT SURVEYS:  FOTO 4 target 59  POSTURE: Patient presents with head forward posture with increased thoracic kyphosis; shoulders rounded and elevated; scapulae abducted and rotated along the thoracic spine; head of the humerus anterior in orientation.   UPPER EXTREMITY ROM: AROM Rt shoulder assessed in standing; PROM assessed in supine   Active ROM Rt Passive ROM Lt  Right eval Left eval Left  03/19/23  Shoulder flexion 162 155 173  Shoulder extension 60 20 64  Shoulder abduction(in scapular plane) 162 150 170  Shoulder adduction     Shoulder internal rotation Thumb T7 UE resting at side  60 in scapular plane   Shoulder external rotation 119 shoulder 90/elbow 90 degrees  30 degrees scapular plane Elbow flexed  76  In scapular plane elbow flexed   Elbow flexion WNL  138  Elbow extension WNL  -3  Wrist flexion WNL    Wrist extension WNL    Wrist ulnar deviation WNL    Wrist radial deviation WNL    Wrist pronation WNL    Wrist supination WNL    (Blank rows = not tested)  UPPER EXTREMITY MMT:  MMT Right eval Left eval  Shoulder flexion    Shoulder extension    Shoulder abduction    Shoulder adduction    Shoulder internal rotation    Shoulder external rotation    Middle trapezius    Lower trapezius    Elbow flexion    Elbow extension    Wrist flexion    Wrist extension    Wrist ulnar deviation    Wrist radial deviation    Wrist pronation    Wrist supination    Grip strength (lbs)    (Blank rows = not tested)  PALPATION:  Muscular tightness Lt pecs; upper trap; shoulder girdle musculature  [facility] Adult PT Treatment:                                                 DATE: 03/25/23 Therapeutic Exercise: Supine: AAROM shoulder flexion assisting with Lt UE 2-3 sec pause x 10  Scap squeeze 10 sec x 10  Shoulder rhythmic stabilization - flexion/extension; horizontal ab/adduction; circles CW/CCW 15 reps each  Sitting: Elbow flexion stretch sitting 20-30 sec x 3    Scap squeeze 10 sec x 10  Prone:  Rowing w/ scap squeeze 2-3 sec x 15 Shoulder extension w/scap squeeze 2-3 sec x 15  Sidelying:  Shoulder ER w/scap squeeze 2-3 sec x 15 Standing  Doorway stretch 3 positions 30 sec x 1  Wall push up x 10 partial range Lt elbow AAROM shoulder ER w/ cane elbow 90 deg 10 sec x 10  Shoulder flexion elbow flexed at counter 10 sec x 10  Wall slide Lt UE elbow flexed 3-5 sec x 5  Row blue TB 5 sec x 10  ER green TB 3 sec x 20 IR green TB 3 sec x 20 Manual Therapy: PROM Lt shoulder pt supine shoulder flexion; scaption; ER/IR in scaption; elbow extension within tissue limits with no pain  Neuromuscular re-ed: Working on posture and alignment in standing and sitting  Modalities: Cold pack x 10 min  Self Care: Encouraged continued use of ice for home.   [facility] Adult PT Treatment:                                                DATE: 03/19/23 Therapeutic Exercise: Supine: AAROM shoulder flexion assisting with Lt UE 2-3 sec pause x 10  Scap squeeze 10 sec x 10  Shoulder rhythmic stabilization - flexion/extension; horizontal ab/adduction; circles CW/CCW 15 reps each  Prone:  Rowing w/ scap squeeze 2-3 sec x 15 Shoulder extension w/scap squeeze 2-3 sec x 15  Sidelying:  Shoulder ER w/scap squeeze 2-3 sec x 15 Standing  Pendulum 30CW/30 CCW Scap squeeze w/noodle 10 sec x 15 Chin tuck w/noodle x 10 sec  AAROM shoulder ER w/ cane elbow 90 deg 10 sec x 10  Shoulder flexion elbow flexed at counter 10 sec x 10  Wall slide Lt UE elbow flexed 3-5 sec x 5  Isometric shoulder abduction; extension; ER 5 sec x 5  Isometric  row blue TB 5 sec x 10  Isometric ER green TB 5 sec x 5 Isometric IR green TB 5 sec x 5  Manual Therapy: PROM Lt shoulder pt supine shoulder flexion; scaption; ER/IR in scaption; extension within tissue limits with no pain  Neuromuscular re-ed: Working on posture and alignment in standing and sitting  Modalities: Has ice machine at home  Self Care: Reviewed post op precautions encouraging caution with exercises and avoiding use of Lt UE   [facility] Adult PT Treatment:                                                DATE: 03/14/23 Therapeutic Exercise: Supine: AAROM shoulder flexion assisting with Lt UE 2-3 sec pause  x 10  Scap squeeze 10 sec x 10  Shoulder rhythmic stabilization - flexion/extension; horizontal ab/adduction; circles CW/CCW 15 reps each  Prone:  Rowing w/ scap squeeze 2-3 sec x 15 Shoulder extension w/scap squeeze 2-3 sec x 15  Sidelying:  Shoulder ER w/scap squeeze 2-3 sec x 15 Standing  Pendulum 30CW/30 CCW Scap squeeze w/noodle 10 sec x 15 Chin tuck w/noodle x 10 sec  AAROM shoulder ER w/ cane elbow 90 deg 10 sec x 10  Shoulder flexion elbow flexed at counter 10 sec x 10  Wall slide Lt UE elbow flexed 3-5 sec x 5  Manual Therapy: PROM Lt shoulder pt supine shoulder flexion; scaption; ER/IR in scaption; extension within tissue limits with no pain  Neuromuscular re-ed: Working on posture and alignment in standing and sitting  Modalities: Has ice machine at home  Self Care: Reviewed post op precautions encouraging caution with exercises and avoiding use of Lt UE    PATIENT EDUCATION: Education details: POC; HEP  Person educated: Patient Education method: Explanation, Demonstration, Tactile cues, Verbal cues, and Handouts Education comprehension: verbalized understanding, returned demonstration, verbal cues required, tactile cues required, and needs further education  HOME EXERCISE PROGRAM:   Access Code: [email or web address] URL: [email or web address] Date:  03/26/2023 Prepared by: Corlis Leak  Exercises - Circular Shoulder Pendulum with Table Support  - 3-4 x daily - 7 x weekly - 1 sets - 20-30 reps - Seated Scapular Retraction  - 2 x daily - 7 x weekly - 1-2 sets - 10 reps - 10 sec  hold - Seated Cervical Retraction  - 2 x daily - 7 x weekly - 1-2 sets - 5-10 reps - 10 sec  hold - Seated Shoulder External Rotation AAROM with Cane and Hand in Neutral  - 2 x daily - 7 x weekly - 1 sets - 5-10 reps - 5-10 sec  hold - Standing 'L' Stretch at [employer]  - 2 x daily - 7 x weekly - 1 sets - 5 reps - 10 sec  hold - Supine Shoulder Flexion AAROM with Hands Clasped  - 2 x daily - 7 x weekly - 1 sets - 5-10 reps - 2-3 sec  hold - Supine Shoulder Rhythmic Stabilization- Flexion/Extension  - 2 x daily - 7 x weekly - 1 sets - 10-15 reps - Prone Shoulder Row  - 2 x daily - 7 x weekly - 1-2 sets - 10 reps - 3 sec  hold - Prone Shoulder Extension - Single Arm  - 2 x daily - 7 x weekly - 1 sets - 10-15 reps - 3 sec  hold - Sidelying Shoulder External Rotation  - 2 x daily - 7 x weekly - 1 sets - 10-15 reps - 3 sec  hold - Standing Isometric Shoulder Extension with Doorway - Arm Bent  - 2 x daily - 7 x weekly - 1 sets - 5-10 reps - 5 sec  hold - Isometric Shoulder Abduction at Wall  - 2 x daily - 7 x weekly - 1 sets - 5-10 reps - 5 sec  hold - Isometric Shoulder External Rotation at Wall  - 2 x daily - 7 x weekly - 1 sets - 5 reps - 5 sec  hold - Standing Isometric Shoulder Internal Rotation with Towel Roll at Doorway  - 2 x daily - 7 x weekly - 1 sets - 5 reps - 5 sec  hold - Standing Shoulder Row Reactive Isometric  - 2 x daily -  7 x weekly - 1 sets - 10 reps - 30-45 sec  hold - Shoulder External Rotation Reactive Isometrics  - 1 x daily - 7 x weekly - 1 sets - 5-10 reps - 10-30 sec  hold - Shoulder Internal Rotation Reactive Isometrics  - 2 x daily - 7 x weekly - 1 sets - 10 reps - 3-5 sec  hold - Shoulder External Rotation with Anchored Resistance  - 2 x daily - 7  x weekly - 1-2 sets - 10 reps - 3 sec  hold - Shoulder Internal Rotation with Resistance  - 2 x daily - 7 x weekly - 2 sets - 10 reps - 3 sec  hold - Doorway Pec Stretch at 60 Degrees Abduction  - 3 x daily - 7 x weekly - 1 sets - 3 reps - Doorway Pec Stretch at 90 Degrees Abduction  - 3 x daily - 7 x weekly - 1 sets - 3 reps - 30 seconds  hold - Doorway Pec Stretch at 120 Degrees Abduction  - 3 x daily - 7 x weekly - 1 sets - 3 reps - 30 second hold  hold - Bicep Stretch at Table  - 2 x daily - 7 x weekly - 1 sets - 3 reps - 20-30 sec  hold - Wall Push Up  - 1 x daily - 7 x weekly - 1-3 sets - 10 reps - 3 sec  hold  ASSESSMENT:  CLINICAL IMPRESSION: Patient demonstrates continued progress with shoulder rehab. He is progressing with strengthening exercises without difficulty. Continued with manual work including joint mobilization and PROM/stretching within tissue limits and per protocol. Added strengthening for shoulder ext/abd/ER/IR and light resistive biceps curl with 2# weight. Progressing well with UE rehab per MD protocol/orders.   He is s/p Lt shoulder surgery for labral repair and biceps tenodesis 02/12/23.     OBJECTIVE IMPAIRMENTS: poor posture and alignment; limited Lt shoulder ROM, strength and functional activity. He has minimal pain. Sleeping is difficult when out of the recliner. Patient is unable to work or use Lt UE for functional and recreational activities, decreased activity tolerance, decreased mobility, decreased ROM, decreased strength, hypomobility, increased fascial restrictions, impaired flexibility, impaired sensation, impaired UE functional use, improper body mechanics, postural dysfunction, and pain.    GOALS: Goals reviewed with patient? Yes  SHORT TERM GOALS: Target date: 03/28/2023   Independent in initial HEP Baseline: Goal status: INITIAL  2.  Wean from sling per MD protocol Baseline:  Goal status: INITIAL   LONG TERM GOALS: Target date:  04/24/2023   Improve posture and alignment with patient demonstrating improved upright posture with posterior shoulder girdle engaged  Baseline:  Goal status: INITIAL  2.  AROM Lt shoulder/elbow/forearm =/> than AROM Rt UE  Baseline:  Goal status: INITIAL  3.  4/5 to 5/5 strength Lt shoulder and elbow  Baseline:  Goal status: INITIAL  4.  Return to normal functional activities including return to work with no limitations of Lt UE Baseline:  Goal status: INITIAL  5.  Independent in HEP including aquatic program as indicated  Baseline:  Goal status: INITIAL  6.  Improve functional limitation score to 59 Baseline: 4 Goal status: INITIAL  PLAN:  PT FREQUENCY: 2x/week  PT DURATION: 8 weeks  PLANNED INTERVENTIONS: Therapeutic exercises, Therapeutic activity, Neuromuscular re-education, Patient/Family education, Self Care, Joint mobilization, Aquatic Therapy, Dry Needling, Electrical stimulation, Spinal mobilization, Cryotherapy, Moist heat, Taping, Vasopneumatic device, Ultrasound, Ionotophoresis 4mg /ml Dexamethasone, Manual therapy, and Re-evaluation  PLAN FOR NEXT SESSION: review and progress exercise; continue with postural correction and education; modalities, manual work, DN as indicated    [employer], PT 03/26/2023, 1:58 PM

## 2023-03-27 ENCOUNTER — Ambulatory Visit: Payer: Managed Care, Other (non HMO) | Admitting: Rehabilitative and Restorative Service Providers"

## 2023-03-28 ENCOUNTER — Encounter: Payer: Self-pay | Admitting: Rehabilitative and Restorative Service Providers"

## 2023-03-28 ENCOUNTER — Ambulatory Visit: Payer: Managed Care, Other (non HMO) | Admitting: Rehabilitative and Restorative Service Providers"

## 2023-03-28 DIAGNOSIS — M25512 Pain in left shoulder: Secondary | ICD-10-CM

## 2023-03-28 DIAGNOSIS — R293 Abnormal posture: Secondary | ICD-10-CM

## 2023-03-28 DIAGNOSIS — M6281 Muscle weakness (generalized): Secondary | ICD-10-CM

## 2023-03-28 DIAGNOSIS — M25612 Stiffness of left shoulder, not elsewhere classified: Secondary | ICD-10-CM

## 2023-03-28 NOTE — Therapy (Signed)
OUTPATIENT PHYSICAL THERAPY SHOULDER TREATMENT   Patient Name: Richard Long MRN: [id number] DOB:04-16-1991, 32 y.o., male Today's Date: 03/28/2023  END OF SESSION:  PT End of Session - 03/28/23 0802     Visit Number 6    Number of Visits 16    Date for PT Re-Evaluation 04/24/23    Authorization Time Period auth required after 5th visit    Authorization - Visit Number 6    Authorization - Number of Visits 20    PT Start Time 0757    PT Stop Time 0845    PT Time Calculation (min) 48 min    Activity Tolerance Patient tolerated treatment well             Past Medical History:  Diagnosis Date   Hypertension    Thyroid disease    Past Surgical History:  Procedure Laterality Date   CYST REMOVAL HAND     Patient Active Problem List   Diagnosis Date Noted   Labral tear of shoulder, left, initial encounter 12/11/2022   Diarrhea 06/19/2022   Well adult exam 02/18/2022   Strain of left soleus muscle 08/11/2021   OSA (obstructive sleep apnea) 02/10/2020   Decreased libido 02/10/2020   Strain of right shoulder 10/20/2019   History of thyroid disorder 10/27/2018   Hypertension goal BP (blood pressure) < 130/80 10/27/2018   Acute pain of right shoulder 10/27/2018   Class 2 severe obesity due to excess calories with serious comorbidity in adult 10/27/2018   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   Vitamin D deficiency 10/07/2017   Chronic tension-type headache, not intractable 04/06/2016   Pure hypercholesterolemia 04/06/2016    PCP: Dr [name]   REFERRING PROVIDER: Dr Francena Hanly   REFERRING DIAG: Labral tear Lt shoulder   THERAPY DIAG:  Acute pain of left shoulder  Muscle weakness (generalized)  Abnormal posture  Stiffness of left shoulder, not elsewhere classified  Rationale for Evaluation and Treatment: Rehabilitation  ONSET DATE: 02/12/23  SUBJECTIVE:                                                                                                                                                                                       SUBJECTIVE STATEMENT: Patient reports that his shoulder area is sore but no pain. Did well with the exercises.   Eval: Patient reports that he has had Lt shoulder pain for the past 6 months with increased pain 1/24. MRI showed labral tear and biceps tendon tear. Underwent Lt shoulder score 02/12/23 with no post op complications.  Hand dominance: Ambidextrous; primarily Rt  PERTINENT HISTORY: Rt shoulder injury with labral repair and biceps tenodesis 2020;  Lt knee injury/damage; anxiety; HTN  PAIN:  Are you having pain? Yes: NPRS scale: 0/10; no longer wakes in the AM in biceps pain but continues to have tightness and spasms briefly  Pain location: biceps  Pain description: gripping  Aggravating factors: lying flat  Relieving factors: propping   PRECAUTIONS: no heavy lifting overhead; no jerking movements; do not use Lt shoulder in sitting/rising; no isolated biceps for 8 weeks per MD protocol   WEIGHT BEARING RESTRICTIONS: No  FALLS:  Has patient fallen in last 6 months? 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 Superior posterior labral tear with an associated moderate-sized dissecting paralabral cyst back into the spinoglenoid notch. No findings to suggest compression of the suprascapular nerve. Buford complex variant. Mild rotator cuff tendinopathy/tendinosis with small interstitial tears mainly involving the infraspinatus and supraspinatus tendons. No partial or full-thickness rotator cuff tear. Intact long head biceps tendon.Os acromiale.     PATIENT SURVEYS:  FOTO 4 target 59  POSTURE: Patient presents with head forward posture with increased thoracic kyphosis; shoulders rounded and elevated; scapulae abducted and rotated along the thoracic spine; head of the humerus anterior in orientation.   UPPER EXTREMITY ROM: AROM Rt shoulder assessed in standing; PROM assessed in supine   Active ROM Rt Passive ROM Lt  Right eval Left eval Left  03/19/23  Shoulder flexion 162 155 173  Shoulder extension 60 20 64  Shoulder abduction(in scapular plane) 162 150 170  Shoulder adduction     Shoulder internal rotation Thumb T7 UE resting at side  60 in scapular plane   Shoulder external rotation 119 shoulder 90/elbow 90 degrees  30 degrees scapular plane Elbow flexed  76  In scapular plane elbow flexed   Elbow flexion WNL  138  Elbow extension WNL  -3  Wrist flexion WNL    Wrist extension WNL    Wrist ulnar deviation WNL    Wrist radial deviation WNL    Wrist pronation WNL    Wrist supination WNL    (Blank rows = not tested)  UPPER EXTREMITY MMT:  MMT Right eval Left eval  Shoulder flexion    Shoulder extension    Shoulder abduction    Shoulder adduction    Shoulder internal rotation    Shoulder external rotation    Middle trapezius    Lower trapezius    Elbow flexion    Elbow extension    Wrist flexion    Wrist extension    Wrist ulnar deviation    Wrist radial deviation    Wrist pronation    Wrist supination    Grip strength (lbs)    (Blank rows = not tested)  PALPATION:  Muscular tightness Lt pecs; upper trap; shoulder girdle musculature    [facility] Adult PT Treatment:                                                DATE: 03/28/23 Therapeutic Exercise: Supine: AAROM shoulder flexion assisting with  Lt UE 2-3 sec pause x 10  Scap squeeze 10 sec x 10  Shoulder rhythmic stabilization - flexion/extension; horizontal ab/adduction; circles CW/CCW 15 reps each  Sitting: Supination/pronation 2# 10 x 3  Prone:   Rowing w/ scap squeeze 2-3 sec 2# x 10 x 3 Shoulder extension w/scap squeeze 2# x 10 x 3  Sidelying:  Shoulder ER w/scap squeeze 2-3 sec x 15 Standing  Doorway stretch 3 positions 30 sec x 1  Biceps curl 2# x 10 x 3 Hammer curl 2# x 10 x 3  Wall push up x 10 partial range Lt elbow Reverse wall push up x 10 x 2 sets  AAROM shoulder ER w/ cane elbow 90 deg 10 sec x 10  Shoulder flexion elbow flexed at counter 10 sec x 10  Counter plank 90 sec x 3   Row blue TB 5 sec x 10  ER green TB 3 sec x 20 IR green TB 3 sec x 20 Bodyblade 1 min x 2  Manual Therapy: PROM Lt shoulder pt supine shoulder flexion; scaption; ER/IR in scaption; elbow extension within tissue limits with no pain  Neuromuscular re-ed: Working on posture and alignment in standing and sitting  Modalities: Cold pack x 10 min  Self Care: Encouraged continued use of ice for home.   [facility] Adult PT Treatment:                                                DATE: 03/25/23 Therapeutic Exercise: Supine: AAROM shoulder flexion assisting with Lt UE 2-3 sec pause x 10  Scap squeeze 10 sec x 10  Shoulder rhythmic stabilization - flexion/extension; horizontal ab/adduction; circles CW/CCW 15 reps each  Sitting: Elbow flexion stretch sitting 20-30 sec x 3    Scap squeeze 10 sec x 10  Prone:  Rowing w/ scap squeeze 2-3 sec x 15 Shoulder extension w/scap squeeze 2-3 sec x 15  Sidelying:  Shoulder ER w/scap squeeze 2-3 sec x 15 Standing  Doorway stretch 3 positions 30 sec x 1  Wall push up x 10 partial range Lt elbow AAROM shoulder ER w/ cane elbow 90 deg 10 sec x 10  Shoulder flexion elbow flexed at counter 10 sec x 10  Wall slide Lt UE elbow flexed 3-5 sec x 5  Row blue TB 5 sec x 10  ER green TB 3 sec x 20 IR green TB 3 sec x 20 Manual Therapy: PROM Lt shoulder pt supine shoulder flexion; scaption; ER/IR in scaption; elbow extension within tissue limits with no pain  Neuromuscular re-ed: Working on posture and alignment in  standing and sitting  Modalities: Cold pack x 10 min  Self Care: Encouraged continued use of ice for home.    PATIENT EDUCATION: Education details: POC; HEP  Person educated: Patient Education method: Programmer, multimedia, Demonstration, Actor cues, Verbal cues, and Handouts Education comprehension: verbalized understanding, returned demonstration, verbal cues required, tactile cues required, and needs further education  HOME EXERCISE PROGRAM:  Access Code: [email or web address] URL: [email or web address] Date: 03/28/2023 Prepared by: Corlis Leak  Exercises - Circular Shoulder Pendulum with Table Support  - 3-4 x daily - 7 x weekly - 1 sets - 20-30 reps - Seated Scapular Retraction  - 2 x daily - 7 x weekly - 1-2 sets - 10 reps - 10 sec  hold - Seated Cervical Retraction  -  2 x daily - 7 x weekly - 1-2 sets - 5-10 reps - 10 sec  hold - Seated Shoulder External Rotation AAROM with Cane and Hand in Neutral  - 2 x daily - 7 x weekly - 1 sets - 5-10 reps - 5-10 sec  hold - Standing 'L' Stretch at [employer]  - 2 x daily - 7 x weekly - 1 sets - 5 reps - 10 sec  hold - Supine Shoulder Flexion AAROM with Hands Clasped  - 2 x daily - 7 x weekly - 1 sets - 5-10 reps - 2-3 sec  hold - Supine Shoulder Rhythmic Stabilization- Flexion/Extension  - 2 x daily - 7 x weekly - 1 sets - 10-15 reps - Prone Shoulder Row  - 2 x daily - 7 x weekly - 1-2 sets - 10 reps - 3 sec  hold - Prone Shoulder Extension - Single Arm  - 2 x daily - 7 x weekly - 1 sets - 10-15 reps - 3 sec  hold - Sidelying Shoulder External Rotation  - 2 x daily - 7 x weekly - 1 sets - 10-15 reps - 3 sec  hold - Standing Isometric Shoulder Extension with Doorway - Arm Bent  - 2 x daily - 7 x weekly - 1 sets - 5-10 reps - 5 sec  hold - Isometric Shoulder Abduction at Wall  - 2 x daily - 7 x weekly - 1 sets - 5-10 reps - 5 sec  hold - Isometric Shoulder External Rotation at Wall  - 2 x daily - 7 x weekly - 1 sets - 5 reps - 5 sec  hold - Standing  Isometric Shoulder Internal Rotation with Towel Roll at Doorway  - 2 x daily - 7 x weekly - 1 sets - 5 reps - 5 sec  hold - Standing Shoulder Row Reactive Isometric  - 2 x daily - 7 x weekly - 1 sets - 10 reps - 30-45 sec  hold - Shoulder External Rotation Reactive Isometrics  - 1 x daily - 7 x weekly - 1 sets - 5-10 reps - 10-30 sec  hold - Shoulder Internal Rotation Reactive Isometrics  - 2 x daily - 7 x weekly - 1 sets - 10 reps - 3-5 sec  hold - Shoulder External Rotation with Anchored Resistance  - 2 x daily - 7 x weekly - 1-2 sets - 10 reps - 3 sec  hold - Shoulder Internal Rotation with Resistance  - 2 x daily - 7 x weekly - 2 sets - 10 reps - 3 sec  hold - Doorway Pec Stretch at 60 Degrees Abduction  - 3 x daily - 7 x weekly - 1 sets - 3 reps - Doorway Pec Stretch at 90 Degrees Abduction  - 3 x daily - 7 x weekly - 1 sets - 3 reps - 30 seconds  hold - Doorway Pec Stretch at 120 Degrees Abduction  - 3 x daily - 7 x weekly - 1 sets - 3 reps - 30 second hold  hold - Bicep Stretch at Table  - 2 x daily - 7 x weekly - 1 sets - 3 reps - 20-30 sec  hold - Wall Push Up  - 1 x daily - 7 x weekly - 1-3 sets - 10 reps - 3 sec  hold - Plank on Counter  - 2 x daily - 7 x weekly - 1 sets - 3 reps - 30 sec  hold  ASSESSMENT:  CLINICAL IMPRESSION: Patient demonstrates continued progress with shoulder rehab. He is progressing with strengthening exercises with some soreness. Continued with manual work including joint mobilization and PROM/stretching within tissue limits and per protocol. Added strengthening for shoulder ext/abd/ER/IR and light resistive biceps curl with 2# weight. Progressing well with UE rehab per MD protocol/orders.   He is s/p Lt shoulder surgery for labral repair and biceps tenodesis 02/12/23.     OBJECTIVE IMPAIRMENTS: poor posture and alignment; limited Lt shoulder ROM, strength and functional activity. He has minimal pain. Sleeping is difficult when out of the recliner. Patient is  unable to work or use Lt UE for functional and recreational activities, decreased activity tolerance, decreased mobility, decreased ROM, decreased strength, hypomobility, increased fascial restrictions, impaired flexibility, impaired sensation, impaired UE functional use, improper body mechanics, postural dysfunction, and pain.    GOALS: Goals reviewed with patient? Yes  SHORT TERM GOALS: Target date: 03/28/2023   Independent in initial HEP Baseline: Goal status: INITIAL  2.  Wean from sling per MD protocol Baseline:  Goal status: INITIAL   LONG TERM GOALS: Target date: 04/24/2023   Improve posture and alignment with patient demonstrating improved upright posture with posterior shoulder girdle engaged  Baseline:  Goal status: INITIAL  2.  AROM Lt shoulder/elbow/forearm =/> than AROM Rt UE  Baseline:  Goal status: INITIAL  3.  4/5 to 5/5 strength Lt shoulder and elbow  Baseline:  Goal status: INITIAL  4.  Return to normal functional activities including return to work with no limitations of Lt UE Baseline:  Goal status: INITIAL  5.  Independent in HEP including aquatic program as indicated  Baseline:  Goal status: INITIAL  6.  Improve functional limitation score to 59 Baseline: 4 Goal status: INITIAL  PLAN:  PT FREQUENCY: 2x/week  PT DURATION: 8 weeks  PLANNED INTERVENTIONS: Therapeutic exercises, Therapeutic activity, Neuromuscular re-education, Patient/Family education, Self Care, Joint mobilization, Aquatic Therapy, Dry Needling, Electrical stimulation, Spinal mobilization, Cryotherapy, Moist heat, Taping, Vasopneumatic device, Ultrasound, Ionotophoresis /ml Dexamethasone, Manual therapy, and Re-evaluation  PLAN FOR NEXT SESSION: review and progress exercise; continue with postural correction and education; modalities, manual work, DN as indicated    [employer], PT 03/28/2023, 8:04 AM

## 2023-03-29 LAB — COMPLETE METABOLIC PANEL WITH GFR
AG Ratio: 2.1 (calc) (ref 1.0–2.5)
ALT: 31 U/L (ref 9–46)
AST: 16 U/L (ref 10–40)
Albumin: 4.5 g/dL (ref 3.6–5.1)
Alkaline phosphatase (APISO): 60 U/L (ref 36–130)
BUN: 13 mg/dL (ref 7–25)
CO2: 27 mmol/L (ref 20–32)
Calcium: 9.2 mg/dL (ref 8.6–10.3)
Chloride: 103 mmol/L (ref 98–110)
Creat: 1.04 mg/dL (ref 0.60–1.26)
Globulin: 2.1 g/dL (calc) (ref 1.9–3.7)
Glucose, Bld: 96 mg/dL (ref 65–99)
Potassium: 4.2 mmol/L (ref 3.5–5.3)
Sodium: 141 mmol/L (ref 135–146)
Total Bilirubin: 0.6 mg/dL (ref 0.2–1.2)
Total Protein: 6.6 g/dL (ref 6.1–8.1)
eGFR: 98 mL/min/{1.73_m2} (ref >=60)

## 2023-03-29 LAB — LIPID PANEL W/REFLEX DIRECT LDL
Cholesterol: 265 mg/dL — ABNORMAL HIGH (ref <200)
HDL: 56 mg/dL (ref >=40)
LDL Cholesterol (Calc): 180 mg/dL (calc) — ABNORMAL HIGH
Non-HDL Cholesterol (Calc): 209 mg/dL (calc) — ABNORMAL HIGH (ref <130)
Total CHOL/HDL Ratio: 4.7 (calc) (ref <5.0)
Triglycerides: 146 mg/dL (ref <150)

## 2023-03-29 LAB — CBC WITH DIFFERENTIAL/PLATELET
Absolute Monocytes: 318 cells/uL (ref 200–950)
Basophils Absolute: 48 cells/uL (ref 0–200)
Basophils Relative: 0.9 %
Eosinophils Absolute: 159 cells/uL (ref 15–500)
Eosinophils Relative: 3 %
HCT: 44.7 % (ref 38.5–50.0)
Hemoglobin: 14.8 g/dL (ref 13.2–17.1)
Lymphs Abs: 1728 cells/uL (ref 850–3900)
MCH: 29.1 pg (ref 27.0–33.0)
MCHC: 33.1 g/dL (ref 32.0–36.0)
MCV: 88 fL (ref 80.0–100.0)
MPV: 9.6 fL (ref 7.5–12.5)
Monocytes Relative: 6 %
Neutro Abs: 3048 cells/uL (ref 1500–7800)
Neutrophils Relative %: 57.5 %
Platelets: 250 10*3/uL (ref 140–400)
RBC: 5.08 10*6/uL (ref 4.20–5.80)
RDW: 12.7 % (ref 11.0–15.0)
Total Lymphocyte: 32.6 %
WBC: 5.3 10*3/uL (ref 3.8–10.8)

## 2023-04-01 ENCOUNTER — Other Ambulatory Visit: Payer: Self-pay | Admitting: Family Medicine

## 2023-04-01 ENCOUNTER — Ambulatory Visit: Payer: Managed Care, Other (non HMO) | Admitting: Rehabilitative and Restorative Service Providers"

## 2023-04-01 ENCOUNTER — Encounter: Payer: Self-pay | Admitting: Rehabilitative and Restorative Service Providers"

## 2023-04-01 DIAGNOSIS — M25512 Pain in left shoulder: Secondary | ICD-10-CM | POA: Diagnosis not present

## 2023-04-01 DIAGNOSIS — R293 Abnormal posture: Secondary | ICD-10-CM

## 2023-04-01 DIAGNOSIS — M6281 Muscle weakness (generalized): Secondary | ICD-10-CM

## 2023-04-01 DIAGNOSIS — M25612 Stiffness of left shoulder, not elsewhere classified: Secondary | ICD-10-CM

## 2023-04-01 MED ORDER — ROSUVASTATIN CALCIUM 10 MG PO TABS: 10.0000 mg | ORAL_TABLET | Freq: Every day | ORAL | 3 refills | Status: DC

## 2023-04-01 NOTE — Therapy (Signed)
OUTPATIENT PHYSICAL THERAPY SHOULDER TREATMENT   Patient Name: Richard Long MRN: [id number] DOB:[birth year], 32 y.o., male Today's Date: 04/01/2023  END OF SESSION:  PT End of Session - 04/01/23 1523     Visit Number 7    Number of Visits 16    Date for PT Re-Evaluation 04/24/23    Authorization Type cigna    Authorization Time Period auth required after 5th visit (submitted)    Authorization - Visit Number 7    Authorization - Number of Visits 20    PT Start Time 1524    PT Stop Time 1609    PT Time Calculation (min) 45 min    Activity Tolerance Patient tolerated treatment well             Past Medical History:  Diagnosis Date   Hypertension    Thyroid disease    Past Surgical History:  Procedure Laterality Date   CYST REMOVAL HAND     Patient Active Problem List   Diagnosis Date Noted   Labral tear of shoulder, left, initial encounter 12/11/2022   Diarrhea 06/19/2022   Well adult exam 02/18/2022   Strain of left soleus muscle 08/11/2021   OSA (obstructive sleep apnea) 02/10/2020   Decreased libido 02/10/2020   Strain of right shoulder 10/20/2019   History of thyroid disorder 10/27/2018   Hypertension goal BP (blood pressure) < 130/80 10/27/2018   Acute pain of right shoulder 10/27/2018   Class 2 severe obesity due to excess calories with serious comorbidity in adult (HCC) 10/27/2018   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   Vitamin D deficiency 10/07/2017   Chronic tension-type headache, not intractable 04/06/2016   Pure hypercholesterolemia 04/06/2016    PCP: Dr [name]   REFERRING PROVIDER: Dr Francena Hanly   REFERRING DIAG: Labral tear Lt shoulder   THERAPY DIAG:  Acute pain of left shoulder  Muscle weakness (generalized)  Abnormal posture  Stiffness of left shoulder, not elsewhere classified  Rationale for Evaluation and Treatment: Rehabilitation  ONSET DATE: 02/12/23  SUBJECTIVE:                                                                                                                                                                                       SUBJECTIVE STATEMENT: Patient reports that his shoulder area is sore but no pain. Exercises are going well.   Eval: Patient reports that he has had Lt shoulder pain for the past 6 months with increased pain 1/24. MRI showed labral tear and biceps tendon tear. Underwent Lt shoulder score 02/12/23 with no post op complications.  Hand dominance: Ambidextrous; primarily Rt  PERTINENT HISTORY: Rt shoulder injury  with labral repair and biceps tenodesis 2020; Lt knee injury/damage; anxiety; HTN  PAIN:  Are you having pain? Yes: NPRS scale: 0/10; no longer wakes in the AM in biceps stiff and tight; less frequent muscle spasms resolve quickly  Pain location: biceps  Pain description: gripping  Aggravating factors: lying flat  Relieving factors: propping   PRECAUTIONS: no heavy lifting overhead; no jerking movements; do not use Lt shoulder in sitting/rising; no isolated biceps for 8 weeks per MD protocol   WEIGHT BEARING RESTRICTIONS: No  FALLS:  Has patient fallen in last 6 months? 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 Superior posterior labral tear with an associated moderate-sized dissecting paralabral cyst back into the spinoglenoid notch. No findings to suggest compression of the suprascapular nerve. Buford complex variant. Mild rotator cuff tendinopathy/tendinosis with small interstitial tears mainly involving the infraspinatus and supraspinatus tendons. No partial or full-thickness rotator cuff tear.  Intact long head biceps tendon.Os acromiale.    PATIENT SURVEYS:  FOTO 4 target 59  POSTURE: Patient presents with head forward posture with increased thoracic kyphosis; shoulders rounded and elevated; scapulae abducted and rotated along the thoracic spine; head of the humerus anterior in orientation.   UPPER EXTREMITY ROM: AROM Rt shoulder assessed in standing; PROM assessed in supine   Active ROM Rt Passive ROM Lt  Right eval Left eval Left  03/19/23  Shoulder flexion 162 155 173  Shoulder extension 60 20 64  Shoulder abduction(in scapular plane) 162 150 170  Shoulder adduction     Shoulder internal rotation Thumb T7 UE resting at side  60 in scapular plane   Shoulder external rotation 119 shoulder 90/elbow 90 degrees  30 degrees scapular plane Elbow flexed  76  In scapular plane elbow flexed   Elbow flexion WNL  138  Elbow extension WNL  -3  Wrist flexion WNL    Wrist extension WNL    Wrist ulnar deviation WNL    Wrist radial deviation WNL    Wrist pronation WNL    Wrist supination WNL    (Blank rows = not tested)  UPPER EXTREMITY MMT:  MMT Right eval Left eval  Shoulder flexion    Shoulder extension    Shoulder abduction    Shoulder adduction    Shoulder internal rotation    Shoulder external rotation    Middle trapezius    Lower trapezius    Elbow flexion    Elbow extension    Wrist flexion    Wrist extension    Wrist ulnar deviation    Wrist radial deviation    Wrist pronation    Wrist supination    Grip strength (lbs)    (Blank rows = not tested)  PALPATION:  Muscular tightness Lt pecs; upper trap; shoulder girdle musculature    [facility] Adult PT Treatment:                                                DATE: 04/01/23 Therapeutic  Exercise: Supine: AAROM shoulder flexion assisting with Lt UE 2-3 sec pause x 10  Scap squeeze 10 sec x 10  Shoulder rhythmic stabilization - flexion/extension; horizontal ab/adduction; circles CW/CCW 15 reps each   Sitting: Supination/pronation 2# 10 x 3  Prone:  Rowing w/ scap squeeze 2-3 sec 2# x 10 x 3 Shoulder extension w/scap squeeze 2# x 10 x 3  Sidelying:  Shoulder ER w/scap squeeze 2-3 sec x 15 Standing  UBE L4 x 4 min alternating each min Doorway stretch 3 positions 30 sec x 1  Biceps curl 5# x 10 x 3 Hammer curl 5# x 10 x 3  Supination/pronation 2# x 10; 3# x 10  Wall push up x 10 partial range Lt elbow x 2 (second set slowly) Reverse wall push up x 10 x 2 sets    Counter plank on elbows 60 sec x 2; 90 sec x 1   Row blue TB 5 sec x 10 x 2 Row 45 degrees blue TB x 10  ER green TB 3 sec x 20 IR green TB 3 sec x 20 Bodyblade flexion shd ~ 50 deg flexion 1 min x 2  Bodyblade bilat UE shoulder flexion chest to overhead 1 min x 2  Manual Therapy: PROM Lt shoulder pt supine shoulder flexion; scaption; ER/IR in scaption; elbow extension within tissue limits with no pain  Neuromuscular re-ed: Working on posture and alignment in standing and sitting  Modalities: Vaso 34 deg low pressure x 10 min  Self Care: Encouraged continued use of ice for home.   [facility] Adult PT Treatment:                                                DATE: 03/28/23 Therapeutic Exercise: Supine: AAROM shoulder flexion assisting with Lt UE 2-3 sec pause x 10  Scap squeeze 10 sec x 10  Shoulder rhythmic stabilization - flexion/extension; horizontal ab/adduction; circles CW/CCW 15 reps each  Sitting: Supination/pronation 2# 10 x 3  Prone:  Rowing w/ scap squeeze 2-3 sec 2# x 10 x 3 Shoulder extension w/scap squeeze 2# x 10 x 3  Sidelying:  Shoulder ER w/scap squeeze 2-3 sec x 15 Standing  Doorway stretch 3 positions 30 sec x 1  Biceps curl 2# x 10 x 3 Hammer curl 2# x 10 x 3  Wall push up x 10 partial range Lt elbow Reverse wall push up x 10 x 2 sets  AAROM shoulder ER w/ cane elbow 90 deg 10 sec x 10  Shoulder flexion elbow flexed at counter 10 sec x 10  Counter plank 90 sec x 3   Row blue TB 5 sec x 10   ER green TB 3 sec x 20 IR green TB 3 sec x 20 Bodyblade 1 min x 2  Manual Therapy: PROM Lt shoulder pt supine shoulder flexion; scaption; ER/IR in scaption; elbow extension within tissue limits with no pain  Neuromuscular re-ed: Working on posture and alignment in standing and sitting  Modalities: Cold pack x 10 min  Self Care: Encouraged continued use of ice for home.    PATIENT EDUCATION: Education details: POC; HEP  Person educated: Patient Education method: Programmer, multimedia, Demonstration, Actor cues, Verbal cues, and Handouts Education comprehension: verbalized understanding, returned demonstration, verbal cues required, tactile cues required, and needs further education  HOME EXERCISE PROGRAM:  Access Code:  [email or web address] URL: [email or web address] Date: 04/01/2023 Prepared by: Corlis Leak  Exercises - Circular Shoulder Pendulum with Table Support  - 3-4 x daily - 7 x weekly - 1 sets - 20-30 reps - Seated Scapular Retraction  - 2 x daily - 7 x weekly - 1-2 sets - 10 reps - 10 sec  hold - Seated Cervical Retraction  - 2 x daily - 7 x weekly - 1-2 sets - 5-10 reps - 10 sec  hold - Seated Shoulder External Rotation AAROM with Cane and Hand in Neutral  - 2 x daily - 7 x weekly - 1 sets - 5-10 reps - 5-10 sec  hold - Standing 'L' Stretch at [employer]  - 2 x daily - 7 x weekly - 1 sets - 5 reps - 10 sec  hold - Supine Shoulder Flexion AAROM with Hands Clasped  - 2 x daily - 7 x weekly - 1 sets - 5-10 reps - 2-3 sec  hold - Supine Shoulder Rhythmic Stabilization- Flexion/Extension  - 2 x daily - 7 x weekly - 1 sets - 10-15 reps - Prone Shoulder Row  - 2 x daily - 7 x weekly - 1-2 sets - 10 reps - 3 sec  hold - Prone Shoulder Extension - Single Arm  - 2 x daily - 7 x weekly - 1 sets - 10-15 reps - 3 sec  hold - Sidelying Shoulder External Rotation  - 2 x daily - 7 x weekly - 1 sets - 10-15 reps - 3 sec  hold - Standing Isometric Shoulder Extension with Doorway - Arm Bent  - 2 x  daily - 7 x weekly - 1 sets - 5-10 reps - 5 sec  hold - Isometric Shoulder Abduction at Wall  - 2 x daily - 7 x weekly - 1 sets - 5-10 reps - 5 sec  hold - Isometric Shoulder External Rotation at Wall  - 2 x daily - 7 x weekly - 1 sets - 5 reps - 5 sec  hold - Standing Isometric Shoulder Internal Rotation with Towel Roll at Doorway  - 2 x daily - 7 x weekly - 1 sets - 5 reps - 5 sec  hold - Standing Shoulder Row Reactive Isometric  - 2 x daily - 7 x weekly - 1 sets - 10 reps - 30-45 sec  hold - Shoulder External Rotation Reactive Isometrics  - 1 x daily - 7 x weekly - 1 sets - 5-10 reps - 10-30 sec  hold - Shoulder Internal Rotation Reactive Isometrics  - 2 x daily - 7 x weekly - 1 sets - 10 reps - 3-5 sec  hold - Shoulder External Rotation with Anchored Resistance  - 2 x daily - 7 x weekly - 1-2 sets - 10 reps - 3 sec  hold - Shoulder Internal Rotation with Resistance  - 2 x daily - 7 x weekly - 2 sets - 10 reps - 3 sec  hold - Doorway Pec Stretch at 60 Degrees Abduction  - 3 x daily - 7 x weekly - 1 sets - 3 reps - Doorway Pec Stretch at 90 Degrees Abduction  - 3 x daily - 7 x weekly - 1 sets - 3 reps - 30 seconds  hold - Doorway Pec Stretch at 120 Degrees Abduction  - 3 x daily - 7 x weekly - 1 sets - 3 reps - 30 second hold  hold - Bicep Stretch at Table  - 2 x daily -  7 x weekly - 1 sets - 3 reps - 20-30 sec  hold - Wall Push Up  - 1 x daily - 7 x weekly - 1-3 sets - 10 reps - 3 sec  hold - Plank on Counter  - 2 x daily - 7 x weekly - 1 sets - 3 reps - 30 sec  hold - Forearm Pronation and Supination with Hammer  - 2 x daily - 7 x weekly - 2-3 sets - 10 reps - 5 sec  hold   ASSESSMENT:  CLINICAL IMPRESSION: Patient demonstrates continued progress with shoulder rehab. He is progressing well with strengthening exercises with some soreness. Added strengthening for shoulder ext/abd/ER/IR and light resistive biceps curl with 5# weight. Progressing well with UE rehab per MD protocol/orders.   He  is s/p Lt shoulder surgery for labral repair and biceps tenodesis 02/12/23.     OBJECTIVE IMPAIRMENTS: poor posture and alignment; limited Lt shoulder ROM, strength and functional activity. He has minimal pain. Sleeping is difficult when out of the recliner. Patient is unable to work or use Lt UE for functional and recreational activities, decreased activity tolerance, decreased mobility, decreased ROM, decreased strength, hypomobility, increased fascial restrictions, impaired flexibility, impaired sensation, impaired UE functional use, improper body mechanics, postural dysfunction, and pain.    GOALS: Goals reviewed with patient? Yes  SHORT TERM GOALS: Target date: 03/28/2023   Independent in initial HEP Baseline: Goal status: INITIAL  2.  Wean from sling per MD protocol Baseline:  Goal status: INITIAL   LONG TERM GOALS: Target date: 04/24/2023   Improve posture and alignment with patient demonstrating improved upright posture with posterior shoulder girdle engaged  Baseline:  Goal status: INITIAL  2.  AROM Lt shoulder/elbow/forearm =/> than AROM Rt UE  Baseline:  Goal status: INITIAL  3.  4/5 to 5/5 strength Lt shoulder and elbow  Baseline:  Goal status: INITIAL  4.  Return to normal functional activities including return to work with no limitations of Lt UE Baseline:  Goal status: INITIAL  5.  Independent in HEP including aquatic program as indicated  Baseline:  Goal status: INITIAL  6.  Improve functional limitation score to 59 Baseline: 4 Goal status: INITIAL  PLAN:  PT FREQUENCY: 2x/week  PT DURATION: 8 weeks  PLANNED INTERVENTIONS: Therapeutic exercises, Therapeutic activity, Neuromuscular re-education, Patient/Family education, Self Care, Joint mobilization, Aquatic Therapy, Dry Needling, Electrical stimulation, Spinal mobilization, Cryotherapy, Moist heat, Taping, Vasopneumatic device, Ultrasound, Ionotophoresis 4mg /ml Dexamethasone, Manual therapy, and  Re-evaluation  PLAN FOR NEXT SESSION: review and progress exercise; continue with postural correction and education; modalities, manual work, DN as indicated    [employer], PT 04/01/2023, 3:25 PM

## 2023-04-03 ENCOUNTER — Ambulatory Visit (INDEPENDENT_AMBULATORY_CARE_PROVIDER_SITE_OTHER): Payer: Managed Care, Other (non HMO) | Admitting: Family Medicine

## 2023-04-03 ENCOUNTER — Encounter: Payer: Self-pay | Admitting: Family Medicine

## 2023-04-03 VITALS — BP 127/91 | HR 79 | Ht 65.0 in | Wt 261.0 lb

## 2023-04-03 DIAGNOSIS — Z0000 Encounter for general adult medical examination without abnormal findings: Secondary | ICD-10-CM

## 2023-04-03 DIAGNOSIS — E7800 Pure hypercholesterolemia, unspecified: Secondary | ICD-10-CM

## 2023-04-03 DIAGNOSIS — R6882 Decreased libido: Secondary | ICD-10-CM

## 2023-04-03 MED ORDER — AMLODIPINE BESYLATE 2.5 MG PO TABS: 2.5000 mg | ORAL_TABLET | Freq: Every day | ORAL | 3 refills | Status: DC

## 2023-04-03 MED ORDER — SEMAGLUTIDE-WEIGHT MANAGEMENT 0.25 MG/0.5ML ~~LOC~~ SOAJ: 0.2500 mg | SUBCUTANEOUS | 0 refills | Status: AC

## 2023-04-03 MED ORDER — SEMAGLUTIDE-WEIGHT MANAGEMENT 0.5 MG/0.5ML ~~LOC~~ SOAJ: 0.5000 mg | SUBCUTANEOUS | 0 refills | Status: AC

## 2023-04-03 MED ORDER — SEMAGLUTIDE-WEIGHT MANAGEMENT 1 MG/0.5ML ~~LOC~~ SOAJ: 1.0000 mg | SUBCUTANEOUS | 0 refills | Status: AC

## 2023-04-03 NOTE — Assessment & Plan Note (Signed)
Well adult Orders Placed This Encounter  Procedures   Hepatic function panel   Testosterone   Lipid Panel w/reflex Direct LDL  Screenings: UTD Immunizations:  UTD Anticipatory guidance/Risk factor reduction:  Recommendations per AVS.

## 2023-04-03 NOTE — Assessment & Plan Note (Signed)
He remains interested in [facility].  Will try to see if this is covered through his insurance.

## 2023-04-03 NOTE — Progress Notes (Signed)
Richard Long - 32 y.o. male MRN [id number]  Date of birth: 1991-05-02  Subjective Chief Complaint  Patient presents with   Annual Exam    HPI Richard Long is a 32 y.o. male here today for annual exam.   He reports that he is doing pretty well.  Labs completed prior to visit.  We recently added his crestor back on.  Recently had shoulder surgery, currently doing PT.    He is using CPAP regularly and feels that he is getting good results from this.  He is tolerating this well.  He is typically moderately active, but shoulder has been a little bit of a set back.  He feels that diet is pretty good.   He is a non-smoker.  Occasional EtOH use.   Review of Systems  Constitutional:  Negative for chills, fever, malaise/fatigue and weight loss.  HENT:  Negative for congestion, ear pain and sore throat.   Eyes:  Negative for blurred vision, double vision and pain.  Respiratory:  Negative for cough and shortness of breath.   Cardiovascular:  Negative for chest pain and palpitations.  Gastrointestinal:  Negative for abdominal pain, blood in stool, constipation, heartburn and nausea.  Genitourinary:  Negative for dysuria and urgency.  Musculoskeletal:  Negative for joint pain and myalgias.  Neurological:  Negative for dizziness and headaches.  Endo/Heme/Allergies:  Does not bruise/bleed easily.  Psychiatric/Behavioral:  Negative for depression. The patient is not nervous/anxious and does not have insomnia.     Allergies  Allergen Reactions   Shellfish Allergy Anaphylaxis and Itching   Shrimp Extract    Tape Rash    Burn    Past Medical History:  Diagnosis Date   Hypertension    Thyroid disease     Past Surgical History:  Procedure Laterality Date   CYST REMOVAL HAND      Social History   Socioeconomic History   Marital status: Married    Spouse name: Not on file   Number of children: Not on file   Years of education: Not on file   Highest education level:  Not on file  Occupational History   Occupation: Emergency planning/management officer  Tobacco Use   Smoking status: Never   Smokeless tobacco: Never  Vaping Use   Vaping Use: Never used  Substance and Sexual Activity   Alcohol use: Yes    Comment: social   Drug use: Never   Sexual activity: Yes    Birth control/protection: None  Other Topics Concern   Not on file  Social History Narrative   Not on file   Social Determinants of Health   Financial Resource Strain: Not on file  Food Insecurity: Not on file  Transportation Needs: Not on file  Physical Activity: Not on file  Stress: Not on file  Social Connections: Not on file    Family History  Problem Relation Age of Onset   Hyperlipidemia Mother    Diabetes Father    Diabetes Maternal Grandmother    Heart attack Maternal Grandfather    Diabetes Maternal Grandfather    Heart attack Paternal Grandfather    Heart disease Paternal Grandfather     Health Maintenance  Topic Date Due   Hepatitis C Screening  04/02/2024 (Originally 10/25/2008)   HIV Screening  04/02/2024 (Originally 10/25/2005)   INFLUENZA VACCINE  07/04/2023   DTaP/Tdap/Td (2 - Td or Tdap) 02/09/2028   HPV VACCINES  Aged Out   COVID-19 Vaccine  Discontinued     -----------------------------------------------------------------------------------------------------------------------------------------------------------------------------------------------------------------  Physical Exam BP (!) 127/91   Pulse 79   Ht 5\' 5"  (1.651 m)   Wt 261 lb (118.4 kg)   SpO2 97%   BMI 43.43 kg/m   Physical Exam Constitutional:      General: He is not in acute distress. HENT:     Head: Normocephalic and atraumatic.     Right Ear: Tympanic membrane and external ear normal.     Left Ear: Tympanic membrane and external ear normal.  Eyes:     General: No scleral icterus. Neck:     Thyroid: No thyromegaly.  Cardiovascular:     Rate and Rhythm: Normal rate and regular rhythm.      Heart sounds: Normal heart sounds.  Pulmonary:     Effort: Pulmonary effort is normal.     Breath sounds: Normal breath sounds.  Abdominal:     General: Bowel sounds are normal. There is no distension.     Palpations: Abdomen is soft.     Tenderness: There is no abdominal tenderness. There is no guarding.  Musculoskeletal:     Cervical back: Normal range of motion.  Lymphadenopathy:     Cervical: No cervical adenopathy.  Skin:    General: Skin is warm and dry.     Findings: No rash.  Neurological:     Mental Status: He is alert and oriented to person, place, and time.     Cranial Nerves: No cranial nerve deficit.     Motor: No abnormal muscle tone.  Psychiatric:        Mood and Affect: Mood normal.        Behavior: Behavior normal.     ------------------------------------------------------------------------------------------------------------------------------------------------------------------------------------------------------------------- Assessment and Plan  Well adult exam Well adult Orders Placed This Encounter  Procedures   Hepatic function panel   Testosterone   Lipid Panel w/reflex Direct LDL  Screenings: UTD Immunizations:  UTD Anticipatory guidance/Risk factor reduction:  Recommendations per AVS.   Class 2 severe obesity due to excess calories with serious comorbidity in adult [facility]) He remains interested in [facility].  Will try to see if this is covered through his insurance.     Meds ordered this encounter  Medications   amLODipine (NORVASC) 2.5 MG tablet    Sig: Take 1 tablet (2.5 mg total) by mouth daily.    Dispense:  90 tablet    Refill:  3   Semaglutide-Weight Management 0.25 MG/0.5ML SOAJ    Sig: Inject 0.25 mg into the skin once a week for 28 days.    Dispense:  2 mL    Refill:  0   Semaglutide-Weight Management 0.5 MG/0.5ML SOAJ    Sig: Inject 0.5 mg into the skin once a week for 28 days.    Dispense:  2 mL    Refill:  0   Semaglutide-Weight  Management 1 MG/0.5ML SOAJ    Sig: Inject 1 mg into the skin once a week for 28 days.    Dispense:  2 mL    Refill:  0    No follow-ups on file.    This visit occurred during the SARS-CoV-2 public health emergency.  Safety protocols were in place, including screening questions prior to the visit, additional usage of staff PPE, and extensive cleaning of exam room while observing appropriate contact time as indicated for disinfecting solutions.

## 2023-04-03 NOTE — Patient Instructions (Signed)
Preventive Care 21-33 Years Old, Male Preventive care refers to lifestyle choices and visits with your health care provider that can promote health and wellness. Preventive care visits are also called wellness exams. What can I expect for my preventive care visit? Counseling During your preventive care visit, your health care provider may ask about your: Medical history, including: Past medical problems. Family medical history. Current health, including: Emotional well-being. Home life and relationship well-being. Sexual activity. Lifestyle, including: Alcohol, nicotine or tobacco, and drug use. Access to firearms. Diet, exercise, and sleep habits. Safety issues such as seatbelt and bike helmet use. Sunscreen use. Work and work environment. Physical exam Your health care provider may check your: Height and weight. These may be used to calculate your BMI (body mass index). BMI is a measurement that tells if you are at a healthy weight. Waist circumference. This measures the distance around your waistline. This measurement also tells if you are at a healthy weight and may help predict your risk of certain diseases, such as type 2 diabetes and high blood pressure. Heart rate and blood pressure. Body temperature. Skin for abnormal spots. What immunizations do I need?  Vaccines are usually given at various ages, according to a schedule. Your health care provider will recommend vaccines for you based on your age, medical history, and lifestyle or other factors, such as travel or where you work. What tests do I need? Screening Your health care provider may recommend screening tests for certain conditions. This may include: Lipid and cholesterol levels. Diabetes screening. This is done by checking your blood sugar (glucose) after you have not eaten for a while (fasting). Hepatitis B test. Hepatitis C test. HIV (human immunodeficiency virus) test. STI (sexually transmitted infection)  testing, if you are at risk. Talk with your health care provider about your test results, treatment options, and if necessary, the need for more tests. Follow these instructions at home: Eating and drinking  Eat a healthy diet that includes fresh fruits and vegetables, whole grains, lean protein, and low-fat dairy products. Drink enough fluid to keep your urine pale yellow. Take vitamin and mineral supplements as recommended by your health care provider. Do not drink alcohol if your health care provider tells you not to drink. If you drink alcohol: Limit how much you have to 0-2 drinks a day. Know how much alcohol is in your drink. In the [place] one drink equals one 12 oz bottle of beer (355 mL), one 5 oz glass of wine (148 mL), or one 1 oz glass of hard liquor (44 mL). Lifestyle Brush your teeth every morning and night with fluoride toothpaste. Floss one time each day. Exercise for at least 30 minutes 5 or more days each week. Do not use any products that contain nicotine or tobacco. These products include cigarettes, chewing tobacco, and vaping devices, such as e-cigarettes. If you need help quitting, ask your health care provider. Do not use drugs. If you are sexually active, practice safe sex. Use a condom or other form of protection to prevent STIs. Find healthy ways to manage stress, such as: Meditation, yoga, or listening to music. Journaling. Talking to a trusted person. Spending time with friends and family. Minimize exposure to UV radiation to reduce your risk of skin cancer. Safety Always wear your seat belt while driving or riding in a vehicle. Do not drive: If you have been drinking alcohol. Do not ride with someone who has been drinking. If you have been using any mind-altering substances   or drugs. While texting. When you are tired or distracted. Wear a helmet and other protective equipment during sports activities. If you have firearms in your house, make sure you  follow all gun safety procedures. Seek help if you have been physically or sexually abused. What's next? Go to your health care provider once a year for an annual wellness visit. Ask your health care provider how often you should have your eyes and teeth checked. Stay up to date on all vaccines. This information is not intended to replace advice given to you by your health care provider. Make sure you discuss any questions you have with your health care provider. Document Revised: 05/17/2021 Document Reviewed: 05/17/2021 Elsevier Patient Education  2023 [employer]vier Inc.  

## 2023-04-04 ENCOUNTER — Ambulatory Visit
Payer: Managed Care, Other (non HMO) | Attending: Orthopedic Surgery | Admitting: Rehabilitative and Restorative Service Providers"

## 2023-04-04 ENCOUNTER — Encounter: Payer: Self-pay | Admitting: Rehabilitative and Restorative Service Providers"

## 2023-04-04 DIAGNOSIS — M6281 Muscle weakness (generalized): Secondary | ICD-10-CM | POA: Diagnosis present

## 2023-04-04 DIAGNOSIS — M25612 Stiffness of left shoulder, not elsewhere classified: Secondary | ICD-10-CM | POA: Diagnosis present

## 2023-04-04 DIAGNOSIS — R293 Abnormal posture: Secondary | ICD-10-CM | POA: Diagnosis present

## 2023-04-04 DIAGNOSIS — M25512 Pain in left shoulder: Secondary | ICD-10-CM | POA: Diagnosis present

## 2023-04-04 NOTE — Therapy (Signed)
OUTPATIENT PHYSICAL THERAPY SHOULDER TREATMENT   Patient Name: Richard Long MRN: [id number] DOB:08/30/1991, 32 y.o., male Today's Date: 04/04/2023  END OF SESSION:  PT End of Session - 04/04/23 1530     Visit Number 8    Number of Visits 16    Date for PT Re-Evaluation 04/24/23    Authorization Type cigna    Authorization Time Period auth required after 5th visit (submitted)    Authorization - Visit Number 8    Authorization - Number of Visits 20    PT Start Time 1529    PT Stop Time 1620    PT Time Calculation (min) 51 min    Activity Tolerance Patient tolerated treatment well             Past Medical History:  Diagnosis Date   Hypertension    Thyroid disease    Past Surgical History:  Procedure Laterality Date   CYST REMOVAL HAND     Patient Active Problem List   Diagnosis Date Noted   Labral tear of shoulder, left, initial encounter 12/11/2022   Diarrhea 06/19/2022   Well adult exam 02/18/2022   Strain of left soleus muscle 08/11/2021   OSA (obstructive sleep apnea) 02/10/2020   Decreased libido 02/10/2020   Strain of right shoulder 10/20/2019   History of thyroid disorder 10/27/2018   Hypertension goal BP (blood pressure) < 130/80 10/27/2018   Acute pain of right shoulder 10/27/2018   Class 2 severe obesity due to excess calories with serious comorbidity in adult (HCC) 10/27/2018   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   Vitamin D deficiency 10/07/2017   Chronic tension-type headache, not intractable 04/06/2016   Pure hypercholesterolemia 04/06/2016    PCP: Dr [name]   REFERRING PROVIDER: Dr Francena Hanly   REFERRING DIAG: Labral tear Lt shoulder   THERAPY DIAG:  Acute pain of left shoulder  Muscle weakness (generalized)  Abnormal posture  Stiffness of left shoulder, not elsewhere classified  Rationale for Evaluation and Treatment: Rehabilitation  ONSET DATE: 02/12/23  SUBJECTIVE:                                                                                                                                                                                       SUBJECTIVE STATEMENT: Patient reports that his shoulder is sore with pulling his shoulder up and back.  No pain. Exercises are going well.   Eval: Patient reports that he has had Lt shoulder pain for the past 6 months with increased pain 1/24. MRI showed labral tear and biceps tendon tear. Underwent Lt shoulder score 02/12/23 with no post op complications.  Hand dominance: Ambidextrous; primarily Rt  PERTINENT HISTORY: Rt shoulder injury with labral repair and biceps tenodesis 2020; Lt knee injury/damage; anxiety; HTN  PAIN:  Are you having pain? Yes: NPRS scale: 0/10; no longer wakes in the AM in biceps stiff and tight; less frequent muscle spasms resolve quickly  Pain location: biceps  Pain description: gripping  Aggravating factors: lying flat  Relieving factors: propping   PRECAUTIONS: no heavy lifting overhead; no jerking movements; do not use Lt shoulder in sitting/rising; no isolated biceps for 8 weeks per MD protocol   WEIGHT BEARING RESTRICTIONS: No  FALLS:  Has patient fallen in last 6 months? 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 Superior posterior labral tear with an associated moderate-sized dissecting paralabral cyst back into the spinoglenoid notch. No findings to suggest compression of the suprascapular nerve. Buford complex variant. Mild rotator cuff tendinopathy/tendinosis with small interstitial tears mainly involving the infraspinatus and supraspinatus tendons. No partial or  full-thickness rotator cuff tear. Intact long head biceps tendon.Os acromiale.    PATIENT SURVEYS:  FOTO 4 target 59  POSTURE: Patient presents with head forward posture with increased thoracic kyphosis; shoulders rounded and elevated; scapulae abducted and rotated along the thoracic spine; head of the humerus anterior in orientation.   UPPER EXTREMITY ROM: AROM Rt shoulder assessed in standing; PROM assessed in supine   Active ROM Rt Passive ROM Lt  Right eval Left eval Left  03/19/23  Shoulder flexion 162 155 173  Shoulder extension 60 20 64  Shoulder abduction(in scapular plane) 162 150 170  Shoulder adduction     Shoulder internal rotation Thumb T7 UE resting at side  60 in scapular plane   Shoulder external rotation 119 shoulder 90/elbow 90 degrees  30 degrees scapular plane Elbow flexed  76  In scapular plane elbow flexed   Elbow flexion WNL  138  Elbow extension WNL  -3  Wrist flexion WNL    Wrist extension WNL    Wrist ulnar deviation WNL    Wrist radial deviation WNL    Wrist pronation WNL    Wrist supination WNL    (Blank rows = not tested)  UPPER EXTREMITY MMT:  MMT Right eval Left eval  Shoulder flexion    Shoulder extension    Shoulder abduction    Shoulder adduction    Shoulder internal rotation    Shoulder external rotation    Middle trapezius    Lower trapezius    Elbow flexion    Elbow extension    Wrist flexion    Wrist extension    Wrist ulnar deviation    Wrist radial deviation    Wrist pronation    Wrist supination    Grip strength (lbs)    (Blank rows = not tested)  PALPATION:  Muscular tightness Lt pecs; upper trap; shoulder girdle musculature    [facility] Adult PT Treatment:  DATE: 04/04/23 Therapeutic Exercise: Supine: AAROM shoulder flexion assisting with Lt UE 2-3 sec pause x 10  Scap squeeze 10 sec x 10  Shoulder rhythmic stabilization - flexion/extension; horizontal ab/adduction;  circles CW/CCW 15 reps each  Sitting: Supination/pronation 2# 10 x 3  Prone:  Rowing w/ scap squeeze 2-3 sec 2# x 10 x 3 Shoulder extension w/scap squeeze 2# x 10 x 3  Sidelying:  Shoulder ER w/scap squeeze 2-3 sec x 15 Standing  UBE L7 x 4 min alternating each min Doorway stretch 3 positions 30 sec x 1  Doorway stretch ball at Lt UE at wrist for stretch into ER in horiz abd 30 sec x 2  Biceps curl 5# x 10 x 3 Hammer curl 5# x 10 x 3  Supination/pronation 3# x 10 x 2 Wall push up x 10 x 2 (second set slowly) Wall push up clap 10 x 2  Reverse wall push up x 10 x 2 sets    Counter plank on elbows 60 sec x 3 Supination/pronation red TB x15 each   Row blue TB 5 sec x 10 x 2 Row 45 degrees blue TB x 10  ER green TB 3 sec x 20 IR green TB 3 sec x 20 Bodyblade flexion shd ~ 80 deg flexion 1 min x 2  Bodyblade bilat UE shoulder flexion chest to overhead 1 min x 2  2.5 x 2 suspended on bamboo pole for biceps curl x 10 hands up; x 10 hands down 2.5 x 2 suspended on bamboo pole for overhead press  Rocker board w/airex pad for rocking side to side plank position 30 sec x 2  Manual Therapy: STM TP release work Lt shoulder  PROM Lt shoulder pt supine shoulder flexion; scaption; ER/IR in scaption; elbow extension within tissue limits with no pain  Neuromuscular re-ed: Working on posture and alignment in standing and sitting  Modalities: Vaso 34 deg low pressure x 10 min  Self Care: Encouraged continued use of ice for home.   [facility] Adult PT Treatment:                                                DATE: 04/01/23 Therapeutic Exercise: Supine: AAROM shoulder flexion assisting with Lt UE 2-3 sec pause x 10  Scap squeeze 10 sec x 10  Shoulder rhythmic stabilization - flexion/extension; horizontal ab/adduction; circles CW/CCW 15 reps each  Sitting: Supination/pronation 2# 10 x 3  Prone:  Rowing w/ scap squeeze 2-3 sec 2# x 10 x 3 Shoulder extension w/scap squeeze 2# x 10 x 3  Sidelying:   Shoulder ER w/scap squeeze 2-3 sec x 15 Standing  UBE L4 x 4 min alternating each min Doorway stretch 3 positions 30 sec x 1  Biceps curl 5# x 10 x 3 Hammer curl 5# x 10 x 3  Supination/pronation 2# x 10; 3# x 10 x 1  Wall push up clap x 10 x 2  Reverse wall push up x 10 x 2 sets    Counter plank on elbows 60 sec x 2; 90 sec x 1   Row blue TB 5 sec x 10 x 2 Row 45 degrees blue TB x 20  ER green TB 3 sec x 20 IR green TB 3 sec x 20 Bodyblade flexion shd ~ 50 deg flexion 1 min x 2  Bodyblade  bilat UE shoulder flexion chest to overhead 1 min x 2  Manual Therapy: PROM Lt shoulder pt supine shoulder flexion; scaption; ER/IR in scaption; elbow extension within tissue limits with no pain  Neuromuscular re-ed: Working on posture and alignment in standing and sitting  Modalities: Vaso 34 deg low pressure x 10 min  Self Care: Encouraged continued use of ice for home.    PATIENT EDUCATION: Education details: POC; HEP  Person educated: Patient Education method: Programmer, multimedia, Demonstration, Actor cues, Verbal cues, and Handouts Education comprehension: verbalized understanding, returned demonstration, verbal cues required, tactile cues required, and needs further education  HOME EXERCISE PROGRAM:  Access Code: [email or web address] URL: [email or web address] Date: 04/01/2023 Prepared by: Corlis Leak  Exercises - Circular Shoulder Pendulum with Table Support  - 3-4 x daily - 7 x weekly - 1 sets - 20-30 reps - Seated Scapular Retraction  - 2 x daily - 7 x weekly - 1-2 sets - 10 reps - 10 sec  hold - Seated Cervical Retraction  - 2 x daily - 7 x weekly - 1-2 sets - 5-10 reps - 10 sec  hold - Seated Shoulder External Rotation AAROM with Cane and Hand in Neutral  - 2 x daily - 7 x weekly - 1 sets - 5-10 reps - 5-10 sec  hold - Standing 'L' Stretch at [employer]  - 2 x daily - 7 x weekly - 1 sets - 5 reps - 10 sec  hold - Supine Shoulder Flexion AAROM with Hands Clasped  - 2 x daily - 7 x  weekly - 1 sets - 5-10 reps - 2-3 sec  hold - Supine Shoulder Rhythmic Stabilization- Flexion/Extension  - 2 x daily - 7 x weekly - 1 sets - 10-15 reps - Prone Shoulder Row  - 2 x daily - 7 x weekly - 1-2 sets - 10 reps - 3 sec  hold - Prone Shoulder Extension - Single Arm  - 2 x daily - 7 x weekly - 1 sets - 10-15 reps - 3 sec  hold - Sidelying Shoulder External Rotation  - 2 x daily - 7 x weekly - 1 sets - 10-15 reps - 3 sec  hold - Standing Isometric Shoulder Extension with Doorway - Arm Bent  - 2 x daily - 7 x weekly - 1 sets - 5-10 reps - 5 sec  hold - Isometric Shoulder Abduction at Wall  - 2 x daily - 7 x weekly - 1 sets - 5-10 reps - 5 sec  hold - Isometric Shoulder External Rotation at Wall  - 2 x daily - 7 x weekly - 1 sets - 5 reps - 5 sec  hold - Standing Isometric Shoulder Internal Rotation with Towel Roll at Doorway  - 2 x daily - 7 x weekly - 1 sets - 5 reps - 5 sec  hold - Standing Shoulder Row Reactive Isometric  - 2 x daily - 7 x weekly - 1 sets - 10 reps - 30-45 sec  hold - Shoulder External Rotation Reactive Isometrics  - 1 x daily - 7 x weekly - 1 sets - 5-10 reps - 10-30 sec  hold - Shoulder Internal Rotation Reactive Isometrics  - 2 x daily - 7 x weekly - 1 sets - 10 reps - 3-5 sec  hold - Shoulder External Rotation with Anchored Resistance  - 2 x daily - 7 x weekly - 1-2 sets - 10 reps - 3 sec  hold - Shoulder Internal Rotation with  Resistance  - 2 x daily - 7 x weekly - 2 sets - 10 reps - 3 sec  hold - Doorway Pec Stretch at 60 Degrees Abduction  - 3 x daily - 7 x weekly - 1 sets - 3 reps - Doorway Pec Stretch at 90 Degrees Abduction  - 3 x daily - 7 x weekly - 1 sets - 3 reps - 30 seconds  hold - Doorway Pec Stretch at 120 Degrees Abduction  - 3 x daily - 7 x weekly - 1 sets - 3 reps - 30 second hold  hold - Bicep Stretch at Table  - 2 x daily - 7 x weekly - 1 sets - 3 reps - 20-30 sec  hold - Wall Push Up  - 1 x daily - 7 x weekly - 1-3 sets - 10 reps - 3 sec  hold -  Plank on Counter  - 2 x daily - 7 x weekly - 1 sets - 3 reps - 30 sec  hold - Forearm Pronation and Supination with Hammer  - 2 x daily - 7 x weekly - 2-3 sets - 10 reps - 5 sec  hold   ASSESSMENT:  CLINICAL IMPRESSION: Patient demonstrates continued progress with shoulder rehab. He is progressing well with strengthening exercises with some soreness. Added strengthening for shoulder ext/abd/ER/IR and light resistive biceps curl with 5# weight. Progressing well with UE rehab per MD protocol/orders.   He is s/p Lt shoulder surgery for labral repair and biceps tenodesis 02/12/23.     OBJECTIVE IMPAIRMENTS: poor posture and alignment; limited Lt shoulder ROM, strength and functional activity. He has minimal pain. Sleeping is difficult when out of the recliner. Patient is unable to work or use Lt UE for functional and recreational activities, decreased activity tolerance, decreased mobility, decreased ROM, decreased strength, hypomobility, increased fascial restrictions, impaired flexibility, impaired sensation, impaired UE functional use, improper body mechanics, postural dysfunction, and pain.    GOALS: Goals reviewed with patient? Yes  SHORT TERM GOALS: Target date: 03/28/2023   Independent in initial HEP Baseline: Goal status: INITIAL  2.  Wean from sling per MD protocol Baseline:  Goal status: INITIAL   LONG TERM GOALS: Target date: 04/24/2023   Improve posture and alignment with patient demonstrating improved upright posture with posterior shoulder girdle engaged  Baseline:  Goal status: INITIAL  2.  AROM Lt shoulder/elbow/forearm =/> than AROM Rt UE  Baseline:  Goal status: INITIAL  3.  4/5 to 5/5 strength Lt shoulder and elbow  Baseline:  Goal status: INITIAL  4.  Return to normal functional activities including return to work with no limitations of Lt UE Baseline:  Goal status: INITIAL  5.  Independent in HEP including aquatic program as indicated  Baseline:  Goal  status: INITIAL  6.  Improve functional limitation score to 59 Baseline: 4 Goal status: INITIAL  PLAN:  PT FREQUENCY: 2x/week  PT DURATION: 8 weeks  PLANNED INTERVENTIONS: Therapeutic exercises, Therapeutic activity, Neuromuscular re-education, Patient/Family education, Self Care, Joint mobilization, Aquatic Therapy, Dry Needling, Electrical stimulation, Spinal mobilization, Cryotherapy, Moist heat, Taping, Vasopneumatic device, Ultrasound, Ionotophoresis 4mg /ml Dexamethasone, Manual therapy, and Re-evaluation  PLAN FOR NEXT SESSION: review and progress exercise; continue with postural correction and education; modalities, manual work, DN as indicated    [employer], PT 04/04/2023, 3:31 PM

## 2023-04-09 ENCOUNTER — Ambulatory Visit: Payer: Managed Care, Other (non HMO) | Admitting: Rehabilitative and Restorative Service Providers"

## 2023-04-09 ENCOUNTER — Encounter: Payer: Self-pay | Admitting: Rehabilitative and Restorative Service Providers"

## 2023-04-09 DIAGNOSIS — M25512 Pain in left shoulder: Secondary | ICD-10-CM | POA: Diagnosis not present

## 2023-04-09 DIAGNOSIS — R293 Abnormal posture: Secondary | ICD-10-CM

## 2023-04-09 DIAGNOSIS — M6281 Muscle weakness (generalized): Secondary | ICD-10-CM

## 2023-04-09 DIAGNOSIS — M25612 Stiffness of left shoulder, not elsewhere classified: Secondary | ICD-10-CM

## 2023-04-09 NOTE — Therapy (Signed)
OUTPATIENT PHYSICAL THERAPY SHOULDER TREATMENT   Patient Name: Mclain Bradway MRN: [id number] DOB:05-19-1991, 32 y.o., male Today's Date: 04/09/2023  END OF SESSION:  PT End of Session - 04/09/23 1619     Visit Number 9    Number of Visits 16    Date for PT Re-Evaluation 04/24/23    Authorization Type cigna    Authorization Time Period auth required after 5th visit (submitted)    Authorization - Visit Number 9    Authorization - Number of Visits 20    PT Start Time 1615    PT Stop Time 1704    PT Time Calculation (min) 49 min    Activity Tolerance Patient tolerated treatment well             Past Medical History:  Diagnosis Date   Hypertension    Thyroid disease    Past Surgical History:  Procedure Laterality Date   CYST REMOVAL HAND     Patient Active Problem List   Diagnosis Date Noted   Labral tear of shoulder, left, initial encounter 12/11/2022   Diarrhea 06/19/2022   Well adult exam 02/18/2022   Strain of left soleus muscle 08/11/2021   OSA (obstructive sleep apnea) 02/10/2020   Decreased libido 02/10/2020   Strain of right shoulder 10/20/2019   History of thyroid disorder 10/27/2018   Hypertension goal BP (blood pressure) < 130/80 10/27/2018   Acute pain of right shoulder 10/27/2018   Class 2 severe obesity due to excess calories with serious comorbidity in adult (HCC) 10/27/2018   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   Vitamin D deficiency 10/07/2017   Chronic tension-type headache, not intractable 04/06/2016   Pure hypercholesterolemia 04/06/2016    PCP: Dr [name]   REFERRING PROVIDER: Dr Francena Hanly   REFERRING DIAG: Labral tear Lt shoulder   THERAPY DIAG:  Acute pain of left shoulder  Muscle weakness (generalized)  Abnormal posture  Stiffness of left shoulder, not elsewhere classified  Rationale for Evaluation and Treatment: Rehabilitation  ONSET DATE: 02/12/23  SUBJECTIVE:                                                                                                                                                                                       SUBJECTIVE STATEMENT: Patient reports that his shoulder is sore but less. No pain. Exercises are going well. Accidentally lifted ~ 30 # backpack today and did not have any pain or problems.   Eval: Patient reports that he has had Lt shoulder pain for the past 6 months with increased pain 1/24. MRI showed labral tear and biceps tendon tear. Underwent Lt shoulder score 02/12/23 with no  post op complications.  Hand dominance: Ambidextrous; primarily Rt  PERTINENT HISTORY: Rt shoulder injury with labral repair and biceps tenodesis 2020; Lt knee injury/damage; anxiety; HTN  PAIN:  Are you having pain? Yes: NPRS scale: 0/10; no longer wakes in the AM in biceps stiff and tight; less frequent muscle spasms resolve quickly  Pain location: biceps  Pain description: gripping  Aggravating factors: lying flat  Relieving factors: propping   PRECAUTIONS: no heavy lifting overhead; no jerking movements; do not use Lt shoulder in sitting/rising; no isolated biceps for 8 weeks per MD protocol   WEIGHT BEARING RESTRICTIONS: No  FALLS:  Has patient fallen in last 6 months? 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 Superior posterior labral tear with an associated moderate-sized dissecting paralabral cyst back into the spinoglenoid notch. No findings to suggest compression of the suprascapular nerve. Buford complex variant. Mild rotator cuff tendinopathy/tendinosis with small interstitial tears mainly involving the  infraspinatus and supraspinatus tendons. No partial or full-thickness rotator cuff tear. Intact long head biceps tendon.Os acromiale.    PATIENT SURVEYS:  FOTO 4 target 59  POSTURE: Patient presents with head forward posture with increased thoracic kyphosis; shoulders rounded and elevated; scapulae abducted and rotated along the thoracic spine; head of the humerus anterior in orientation.   UPPER EXTREMITY ROM: AROM Rt shoulder assessed in standing; PROM assessed in supine   Active ROM Rt Passive ROM Lt  Right eval Left eval Left  03/19/23  Shoulder flexion 162 155 173  Shoulder extension 60 20 64  Shoulder abduction(in scapular plane) 162 150 170  Shoulder adduction     Shoulder internal rotation Thumb T7 UE resting at side  60 in scapular plane   Shoulder external rotation 119 shoulder 90/elbow 90 degrees  30 degrees scapular plane Elbow flexed  76  In scapular plane elbow flexed   Elbow flexion WNL  138  Elbow extension WNL  -3  Wrist flexion WNL    Wrist extension WNL    Wrist ulnar deviation WNL    Wrist radial deviation WNL    Wrist pronation WNL    Wrist supination WNL    (Blank rows = not tested)  UPPER EXTREMITY MMT:  MMT Right eval Left eval  Shoulder flexion    Shoulder extension    Shoulder abduction    Shoulder adduction    Shoulder internal rotation    Shoulder external rotation    Middle trapezius    Lower trapezius    Elbow flexion    Elbow extension    Wrist flexion    Wrist extension    Wrist ulnar deviation    Wrist radial deviation    Wrist pronation    Wrist supination    Grip strength (lbs)    (Blank rows = not tested)  PALPATION:  Muscular tightness Lt pecs; upper trap; shoulder girdle musculature    [facility] Adult PT Treatment:  DATE: 04/09/23 Therapeutic Exercise: Supine: AAROM shoulder flexion assisting with Lt UE 2-3 sec pause x 10  Scap squeeze 10 sec x 10  Shoulder rhythmic  stabilization - flexion/extension; horizontal ab/adduction; circles CW/CCW 15 reps each  Sitting: Supination/pronation 2# 10 x 3 HEP Prone:  Rowing w/ scap squeeze 2-3 sec 2# x 10 x 3 HEP  Shoulder extension w/scap squeeze 2# x 10 x 3 HEP  Sidelying:  Shoulder ER w/scap squeeze 2-3 sec x 15 HEP Standing  UBE L10 x 4 min alternating each min Doorway stretch 3 positions 30 sec x 1  Doorway stretch ball at Lt UE at wrist for stretch into ER in horiz abd 30 sec x 2  Cable row 10# x 10 x 2  Cable triceps 5# x 10 x 2  Farmers carry 5# bilat x 125 ft  Biceps curl 5# x 10 x 3 Hammer curl 5# x 10 x 3  Supination/pronation 3# x 10 x 2 Wall push up x 10 x 2 from lowered surface  Counter plank on elbows 60 sec x 3 Row blue TB 5 sec x 10 x 2 ER blue TB 3 sec x 20 IR blue TB 3 sec x 20 Bodyblade flexion shd ~ 80 deg flexion 1 min x 2  Bodyblade bilat UE shoulder flexion chest to overhead 1 min x 2   Manual Therapy: STM TP release work Lt shoulder  PROM Lt shoulder pt supine shoulder flexion; scaption; ER/IR in scaption; elbow extension within tissue limits with no pain  Neuromuscular re-ed: Working on posture and alignment in standing and sitting  Modalities: Vaso 34 deg low pressure x 10 min  Self Care: Encouraged continued use of ice for home.    [facility] Adult PT Treatment:                                                DATE: 04/04/23 Therapeutic Exercise: Supine: AAROM shoulder flexion assisting with Lt UE 2-3 sec pause x 10  Scap squeeze 10 sec x 10  Shoulder rhythmic stabilization - flexion/extension; horizontal ab/adduction; circles CW/CCW 15 reps each  Sitting: Supination/pronation 2# 10 x 3  Prone:  Rowing w/ scap squeeze 2-3 sec 2# x 10 x 3 Shoulder extension w/scap squeeze 2# x 10 x 3  Sidelying:  Shoulder ER w/scap squeeze 2-3 sec x 15 Standing  UBE L7 x 4 min alternating each min Doorway stretch 3 positions 30 sec x 1  Doorway stretch ball at Lt UE at wrist for stretch  into ER in horiz abd 30 sec x 2  Biceps curl 5# x 10 x 3 Hammer curl 5# x 10 x 3  Supination/pronation 3# x 10 x 2 Wall push up x 10 x 2 (second set slowly) Wall push up clap 10 x 2  Reverse wall push up x 10 x 2 sets    Counter plank on elbows 60 sec x 3 Supination/pronation red TB x15 each   Row blue TB 5 sec x 10 x 2 Row 45 degrees blue TB x 10  ER green TB 3 sec x 20 IR green TB 3 sec x 20 Bodyblade flexion shd ~ 80 deg flexion 1 min x 2  Bodyblade bilat UE shoulder flexion chest to overhead 1 min x 2  2.5 x 2 suspended on bamboo pole for biceps curl x  10 hands up; x 10 hands down 2.5 x 2 suspended on bamboo pole for overhead press  Rocker board w/airex pad for rocking side to side plank position 30 sec x 2  Manual Therapy: STM TP release work Lt shoulder  PROM Lt shoulder pt supine shoulder flexion; scaption; ER/IR in scaption; elbow extension within tissue limits with no pain  Neuromuscular re-ed: Working on posture and alignment in standing and sitting  Modalities: Vaso 34 deg low pressure x 10 min  Self Care: Encouraged continued use of ice for home.    PATIENT EDUCATION: Education details: POC; HEP  Person educated: Patient Education method: Programmer, multimedia, Demonstration, Actor cues, Verbal cues, and Handouts Education comprehension: verbalized understanding, returned demonstration, verbal cues required, tactile cues required, and needs further education  HOME EXERCISE PROGRAM:  Access Code: [email or web address] URL: [email or web address] Date: 04/01/2023 Prepared by: Corlis Leak  Exercises - Circular Shoulder Pendulum with Table Support  - 3-4 x daily - 7 x weekly - 1 sets - 20-30 reps - Seated Scapular Retraction  - 2 x daily - 7 x weekly - 1-2 sets - 10 reps - 10 sec  hold - Seated Cervical Retraction  - 2 x daily - 7 x weekly - 1-2 sets - 5-10 reps - 10 sec  hold - Seated Shoulder External Rotation AAROM with Cane and Hand in Neutral  - 2 x daily - 7 x weekly  - 1 sets - 5-10 reps - 5-10 sec  hold - Standing 'L' Stretch at [employer]  - 2 x daily - 7 x weekly - 1 sets - 5 reps - 10 sec  hold - Supine Shoulder Flexion AAROM with Hands Clasped  - 2 x daily - 7 x weekly - 1 sets - 5-10 reps - 2-3 sec  hold - Supine Shoulder Rhythmic Stabilization- Flexion/Extension  - 2 x daily - 7 x weekly - 1 sets - 10-15 reps - Prone Shoulder Row  - 2 x daily - 7 x weekly - 1-2 sets - 10 reps - 3 sec  hold - Prone Shoulder Extension - Single Arm  - 2 x daily - 7 x weekly - 1 sets - 10-15 reps - 3 sec  hold - Sidelying Shoulder External Rotation  - 2 x daily - 7 x weekly - 1 sets - 10-15 reps - 3 sec  hold - Standing Isometric Shoulder Extension with Doorway - Arm Bent  - 2 x daily - 7 x weekly - 1 sets - 5-10 reps - 5 sec  hold - Isometric Shoulder Abduction at Wall  - 2 x daily - 7 x weekly - 1 sets - 5-10 reps - 5 sec  hold - Isometric Shoulder External Rotation at Wall  - 2 x daily - 7 x weekly - 1 sets - 5 reps - 5 sec  hold - Standing Isometric Shoulder Internal Rotation with Towel Roll at Doorway  - 2 x daily - 7 x weekly - 1 sets - 5 reps - 5 sec  hold - Standing Shoulder Row Reactive Isometric  - 2 x daily - 7 x weekly - 1 sets - 10 reps - 30-45 sec  hold - Shoulder External Rotation Reactive Isometrics  - 1 x daily - 7 x weekly - 1 sets - 5-10 reps - 10-30 sec  hold - Shoulder Internal Rotation Reactive Isometrics  - 2 x daily - 7 x weekly - 1 sets - 10 reps - 3-5 sec  hold - Shoulder External  Rotation with Anchored Resistance  - 2 x daily - 7 x weekly - 1-2 sets - 10 reps - 3 sec  hold - Shoulder Internal Rotation with Resistance  - 2 x daily - 7 x weekly - 2 sets - 10 reps - 3 sec  hold - Doorway Pec Stretch at 60 Degrees Abduction  - 3 x daily - 7 x weekly - 1 sets - 3 reps - Doorway Pec Stretch at 90 Degrees Abduction  - 3 x daily - 7 x weekly - 1 sets - 3 reps - 30 seconds  hold - Doorway Pec Stretch at 120 Degrees Abduction  - 3 x daily - 7 x weekly - 1 sets  - 3 reps - 30 second hold  hold - Bicep Stretch at Table  - 2 x daily - 7 x weekly - 1 sets - 3 reps - 20-30 sec  hold - Wall Push Up  - 1 x daily - 7 x weekly - 1-3 sets - 10 reps - 3 sec  hold - Plank on Counter  - 2 x daily - 7 x weekly - 1 sets - 3 reps - 30 sec  hold - Forearm Pronation and Supination with Hammer  - 2 x daily - 7 x weekly - 2-3 sets - 10 reps - 5 sec  hold   ASSESSMENT:  CLINICAL IMPRESSION: Patient is 8 weeks post op today. He demonstrates continued progress with shoulder rehab. He is progressing well with strengthening exercises with some soreness. Added strengthening for shoulder ext/abd/ER/IR and light resistive biceps curl with 5# weight. Progressing well with UE rehab per MD protocol/orders.   He is s/p Lt shoulder surgery for labral repair and biceps tenodesis 02/12/23.     OBJECTIVE IMPAIRMENTS: poor posture and alignment; limited Lt shoulder ROM, strength and functional activity. He has minimal pain. Sleeping is difficult when out of the recliner. Patient is unable to work or use Lt UE for functional and recreational activities, decreased activity tolerance, decreased mobility, decreased ROM, decreased strength, hypomobility, increased fascial restrictions, impaired flexibility, impaired sensation, impaired UE functional use, improper body mechanics, postural dysfunction, and pain.    GOALS: Goals reviewed with patient? Yes  SHORT TERM GOALS: Target date: 03/28/2023   Independent in initial HEP Baseline: Goal status: INITIAL  2.  Wean from sling per MD protocol Baseline:  Goal status: INITIAL   LONG TERM GOALS: Target date: 04/24/2023   Improve posture and alignment with patient demonstrating improved upright posture with posterior shoulder girdle engaged  Baseline:  Goal status: INITIAL  2.  AROM Lt shoulder/elbow/forearm =/> than AROM Rt UE  Baseline:  Goal status: INITIAL  3.  4/5 to 5/5 strength Lt shoulder and elbow  Baseline:  Goal  status: INITIAL  4.  Return to normal functional activities including return to work with no limitations of Lt UE Baseline:  Goal status: INITIAL  5.  Independent in HEP including aquatic program as indicated  Baseline:  Goal status: INITIAL  6.  Improve functional limitation score to 59 Baseline: 4 Goal status: INITIAL  PLAN:  PT FREQUENCY: 2x/week  PT DURATION: 8 weeks  PLANNED INTERVENTIONS: Therapeutic exercises, Therapeutic activity, Neuromuscular re-education, Patient/Family education, Self Care, Joint mobilization, Aquatic Therapy, Dry Needling, Electrical stimulation, Spinal mobilization, Cryotherapy, Moist heat, Taping, Vasopneumatic device, Ultrasound, Ionotophoresis 4mg /ml Dexamethasone, Manual therapy, and Re-evaluation  PLAN FOR NEXT SESSION: review and progress exercise; continue with postural correction and education; modalities, manual work, DN as indicated  Roen Macgowan Rober Minion, PT 04/09/2023, 4:20 PM

## 2023-04-11 ENCOUNTER — Ambulatory Visit: Payer: Managed Care, Other (non HMO) | Admitting: Rehabilitative and Restorative Service Providers"

## 2023-04-13 IMAGING — DX DG KNEE COMPLETE 4+V*L*
4 series · 4 of 4 positions shown · non-contrast
Comparison: 01/07/2021

CLINICAL DATA: Left knee pain. Injured left leg doing martial arts.

EXAM:
LEFT KNEE - COMPLETE 4+ VIEW

[knee lat]
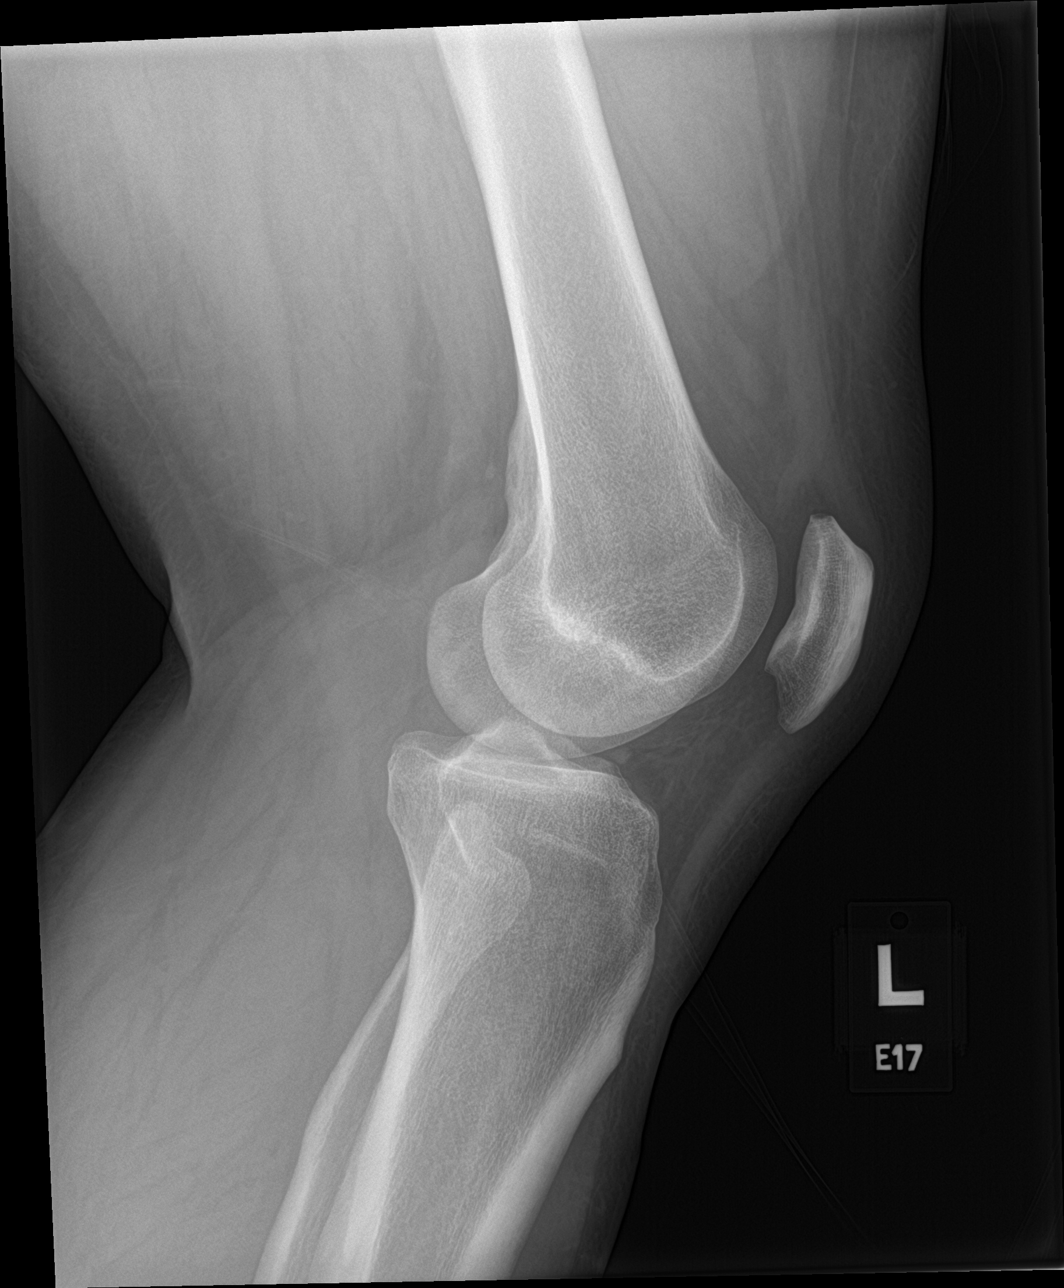

[knee obl (1 of 3)]
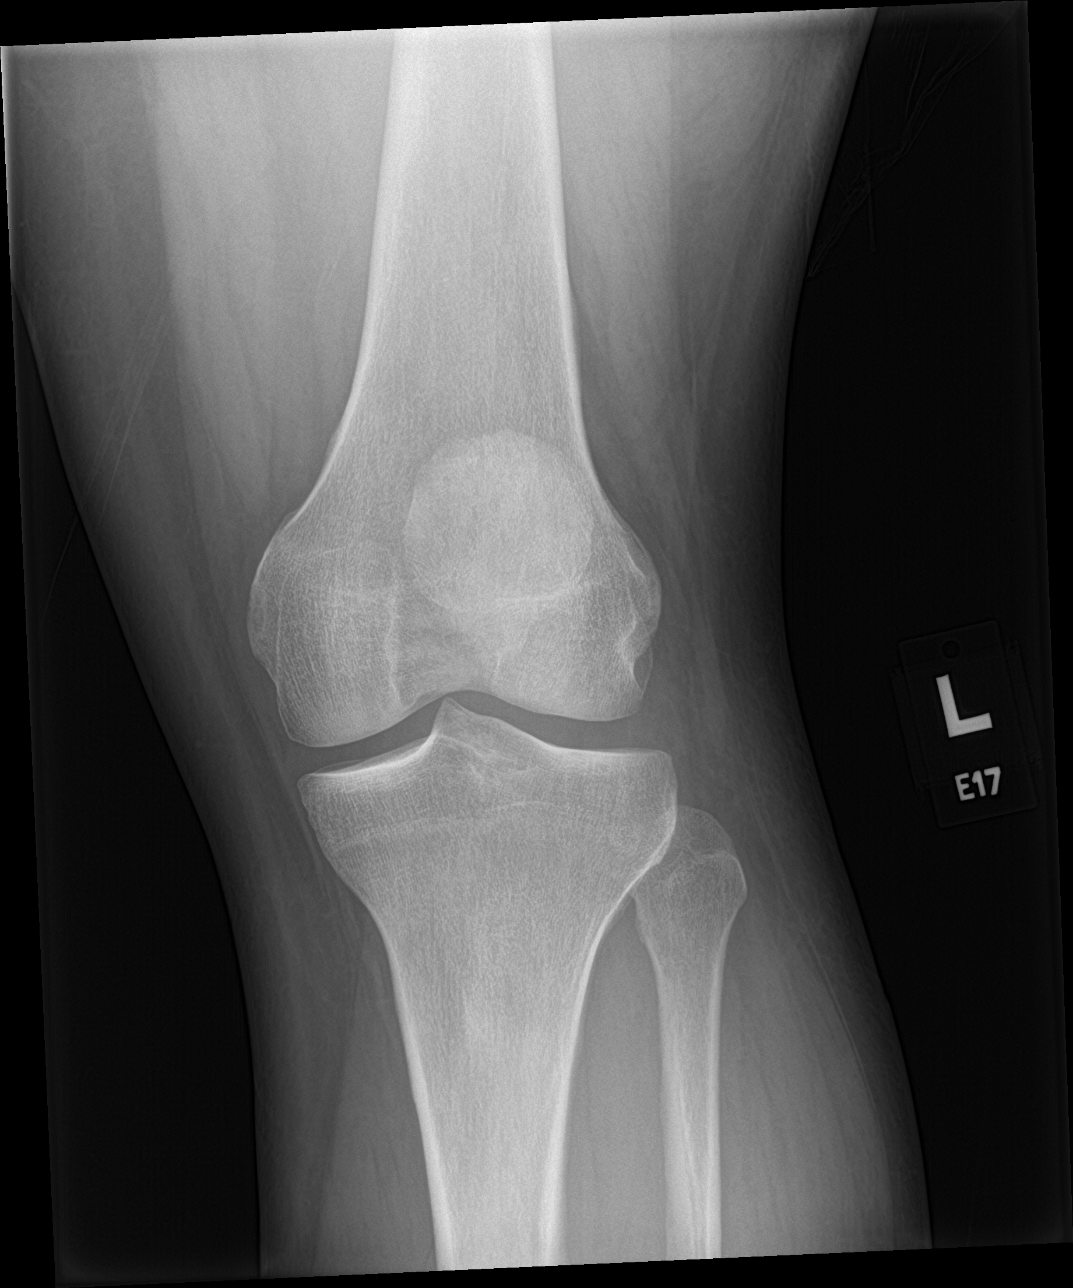

[knee obl (2 of 3)]
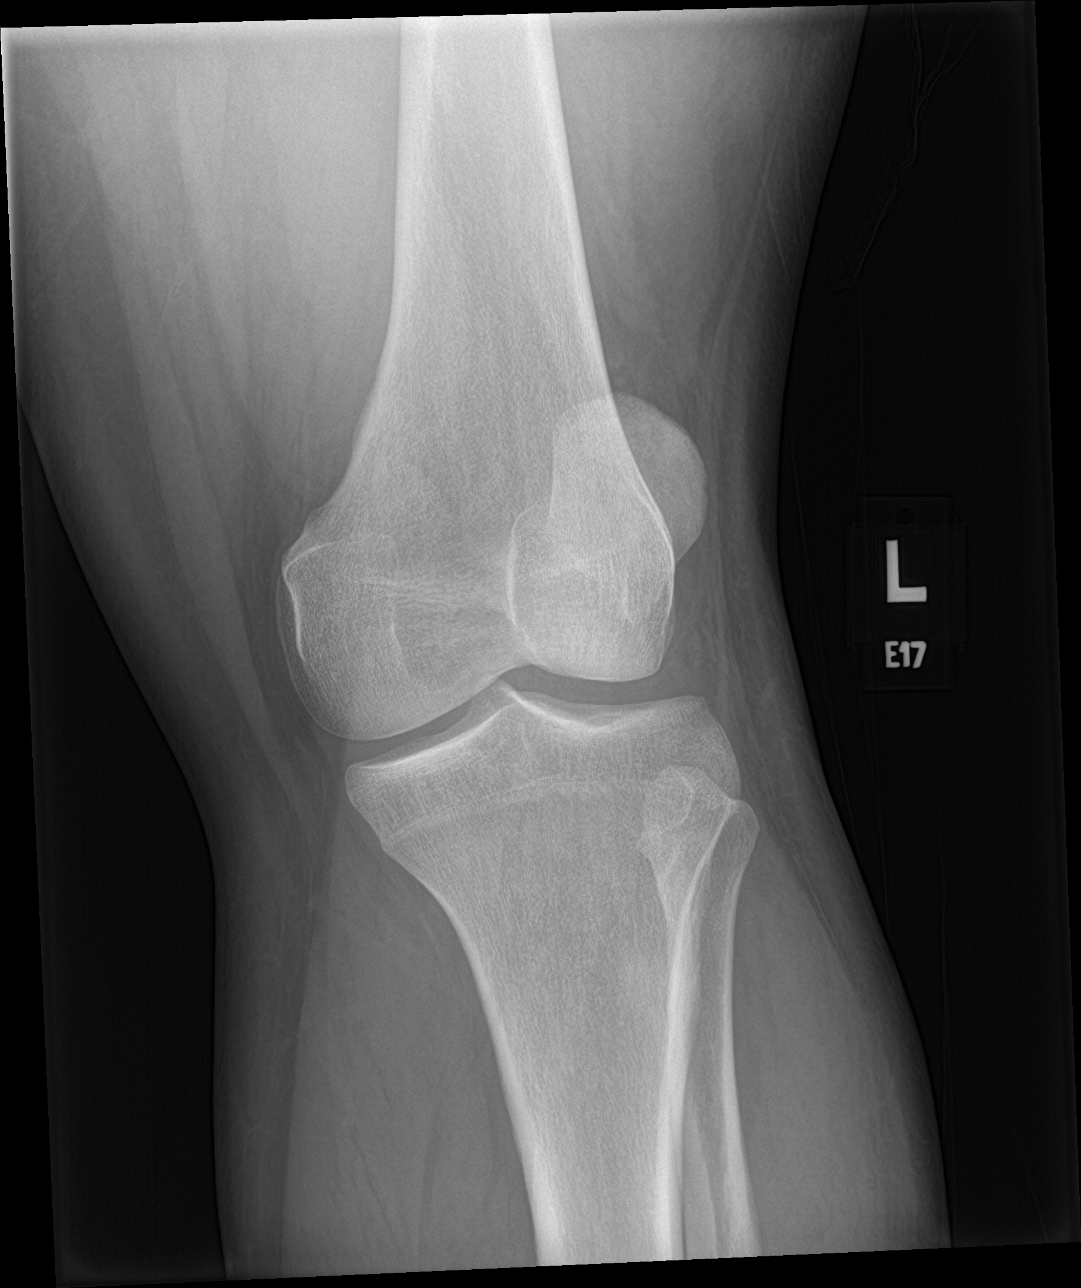

[knee obl (3 of 3)]
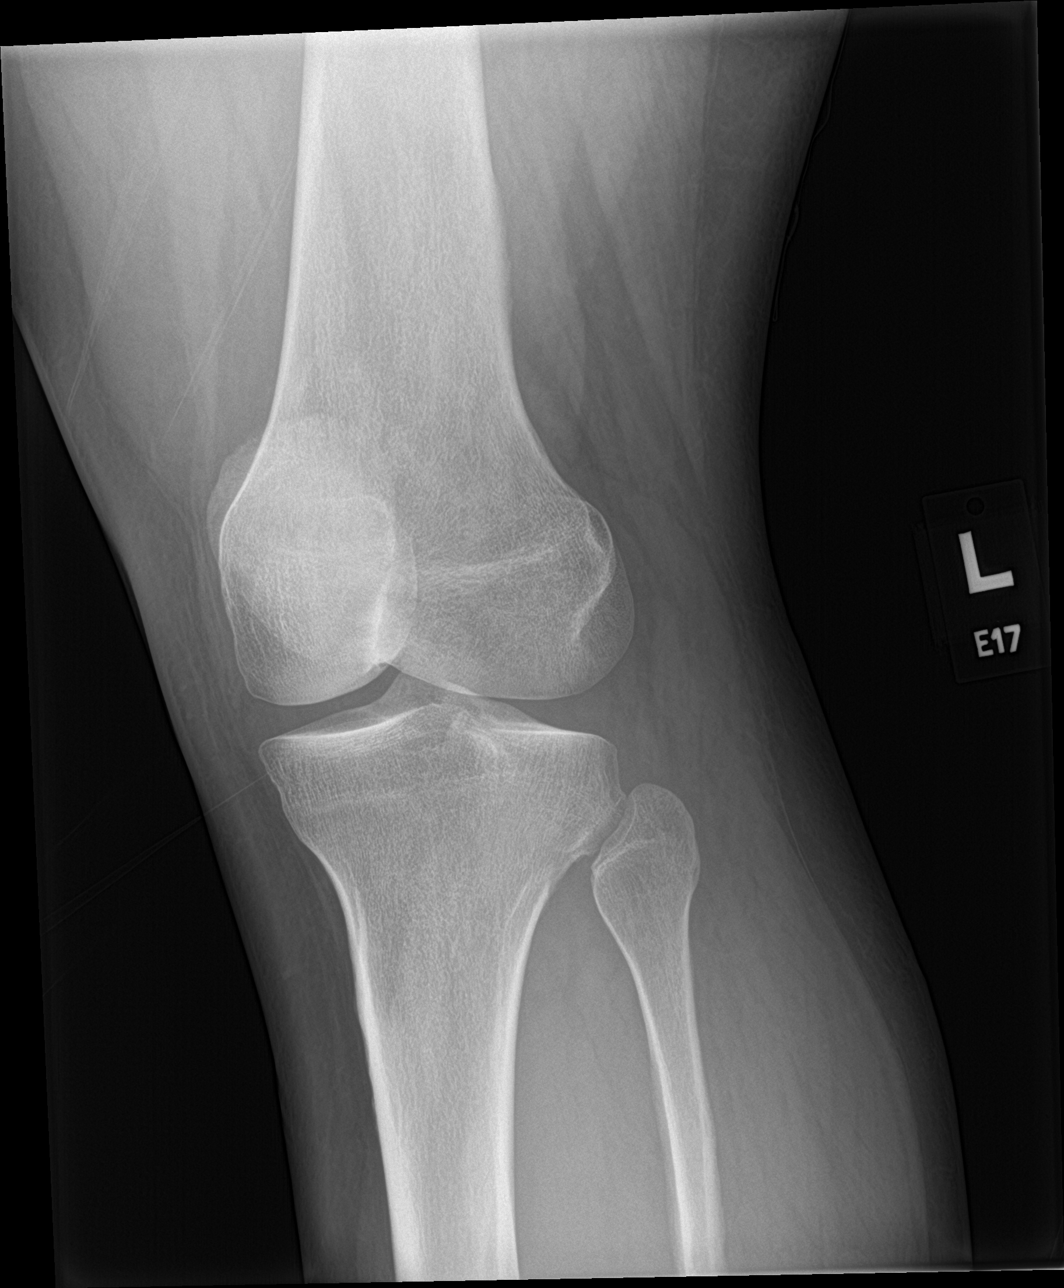

[4 of 4 positions shown; findings below may reference images not displayed]

FINDINGS: No fracture or bone lesion.

Knee joint is normally spaced and aligned.  No joint effusion.

Soft tissues are unremarkable.
IMPRESSION: Negative.

## 2023-04-16 ENCOUNTER — Encounter: Payer: Self-pay | Admitting: Rehabilitative and Restorative Service Providers"

## 2023-04-16 ENCOUNTER — Ambulatory Visit: Payer: Managed Care, Other (non HMO) | Admitting: Rehabilitative and Restorative Service Providers"

## 2023-04-16 DIAGNOSIS — M6281 Muscle weakness (generalized): Secondary | ICD-10-CM

## 2023-04-16 DIAGNOSIS — M25512 Pain in left shoulder: Secondary | ICD-10-CM

## 2023-04-16 DIAGNOSIS — M25612 Stiffness of left shoulder, not elsewhere classified: Secondary | ICD-10-CM

## 2023-04-16 DIAGNOSIS — R293 Abnormal posture: Secondary | ICD-10-CM

## 2023-04-16 NOTE — Therapy (Signed)
OUTPATIENT PHYSICAL THERAPY SHOULDER TREATMENT   Patient Name: Richard Long MRN: [id number] DOB:Aug 29, 1991, 32 y.o., male Today's Date: 04/16/2023  END OF SESSION:  PT End of Session - 04/16/23 1615     Visit Number 10    Number of Visits 16    Date for PT Re-Evaluation 04/24/23    Authorization - Visit Number 10    Authorization - Number of Visits 20    PT Start Time 1530    PT Stop Time 1615    PT Time Calculation (min) 45 min    Activity Tolerance Patient tolerated treatment well    Behavior During Therapy [facility] for tasks assessed/performed              Past Medical History:  Diagnosis Date   Hypertension    Thyroid disease    Past Surgical History:  Procedure Laterality Date   CYST REMOVAL HAND     Patient Active Problem List   Diagnosis Date Noted   Labral tear of shoulder, left, initial encounter 12/11/2022   Diarrhea 06/19/2022   Well adult exam 02/18/2022   Strain of left soleus muscle 08/11/2021   OSA (obstructive sleep apnea) 02/10/2020   Decreased libido 02/10/2020   Strain of right shoulder 10/20/2019   History of thyroid disorder 10/27/2018   Hypertension goal BP (blood pressure) < 130/80 10/27/2018   Acute pain of right shoulder 10/27/2018   Class 2 severe obesity due to excess calories with serious comorbidity in adult [facility]) 10/27/2018   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   Vitamin D deficiency 10/07/2017   Chronic tension-type headache, not intractable 04/06/2016   Pure hypercholesterolemia 04/06/2016    PCP: Dr [name]   REFERRING PROVIDER: Dr Francena Hanly   REFERRING DIAG: Labral tear Lt shoulder   THERAPY DIAG:  Acute pain of left shoulder  Muscle weakness (generalized)  Abnormal posture  Stiffness of left shoulder, not elsewhere classified  Rationale for Evaluation and Treatment: Rehabilitation  ONSET DATE: 02/12/23  SUBJECTIVE:                                                                                                                                                                                       SUBJECTIVE STATEMENT: Patient reports that his shoulder is only sore occasionally. He has been performing HEP  Eval: Patient reports that he has had Lt shoulder pain for the past 6 months with increased pain 1/24. MRI showed labral tear and biceps tendon tear. Underwent Lt shoulder score 02/12/23 with no post op complications.  Hand dominance: Ambidextrous; primarily Rt  PERTINENT HISTORY: Rt shoulder injury with labral repair and biceps tenodesis 2020; Lt knee  injury/damage; anxiety; HTN  PAIN:  Are you having pain? Yes: NPRS scale: 0/10; no longer wakes in the AM in biceps stiff and tight; less frequent muscle spasms resolve quickly  Pain location: biceps  Pain description: gripping  Aggravating factors: lying flat  Relieving factors: propping   PRECAUTIONS: no heavy lifting overhead; no jerking movements; do not use Lt shoulder in sitting/rising; no isolated biceps for 8 weeks per MD protocol   WEIGHT BEARING RESTRICTIONS: No  FALLS:  Has patient fallen in last 6 months? 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 Superior posterior labral tear with an associated moderate-sized dissecting paralabral cyst back into the spinoglenoid notch. No findings to suggest compression of the suprascapular nerve. Buford complex variant. Mild rotator cuff tendinopathy/tendinosis with small interstitial tears mainly involving the infraspinatus and supraspinatus tendons. No partial or full-thickness rotator cuff tear. Intact long head biceps tendon.Os  acromiale.    PATIENT SURVEYS:  FOTO 4 target 59 FOTO 53 (04/16/23)  POSTURE: Patient presents with head forward posture with increased thoracic kyphosis; shoulders rounded and elevated; scapulae abducted and rotated along the thoracic spine; head of the humerus anterior in orientation.   UPPER EXTREMITY ROM: AROM Rt shoulder assessed in standing; PROM assessed in supine   Active ROM Rt Passive ROM Lt  Right eval Left eval Left  03/19/23 Left 04/16/23  Shoulder flexion 162 155 173 178  Shoulder extension 60 20 64   Shoulder abduction(in scapular plane) 162 150 170 176  Shoulder adduction      Shoulder internal rotation Thumb T7 UE resting at side  60 in scapular plane  70 scapular plane  Shoulder external rotation 119 shoulder 90/elbow 90 degrees  30 degrees scapular plane Elbow flexed  76  In scapular plane elbow flexed  85 scapular plane elbow flexed  Elbow flexion WNL  138 WNL  Elbow extension WNL  -3 WNL  Wrist flexion WNL     Wrist extension WNL     Wrist ulnar deviation WNL     Wrist radial deviation WNL     Wrist pronation WNL     Wrist supination WNL     (Blank rows = not tested)  UPPER EXTREMITY MMT:  MMT Right eval Left eval  Shoulder flexion    Shoulder extension    Shoulder abduction    Shoulder adduction    Shoulder internal rotation    Shoulder external rotation    Middle trapezius    Lower trapezius    Elbow flexion    Elbow extension    Wrist flexion    Wrist extension    Wrist ulnar deviation    Wrist radial deviation    Wrist pronation    Wrist supination    Grip strength (lbs)    (Blank rows = not tested)  PALPATION:  Muscular tightness Lt pecs; upper trap; shoulder girdle musculature  [facility] Adult PT Treatment:  DATE: 04/16/23 Therapeutic Exercise: UBE L10 x 4 min alt every min fwd/bkwd Doorway stretch 30 sec each position Row 10# x 20 Triceps cable machine 5# x 20 Bicep curl 5# x 30 Hammer  curl 5# x 30 Farmers carry 5# x 200' Bottoms up KB carry 5# x 110' Hold in shoulder flexion 5#KB x 110' Scaption with opp UE hold 2 x 10 5# Rhythmic stabilization ball on wall 3 x 30 sec Body blade shoulder flexion x 1 min Body blade x 1 min chest level to over head Elevated forearm plank x 1 min Manual Therapy: STM Pecs, Lt shoulder mm     [facility] Adult PT Treatment:                                                DATE: 04/09/23 Therapeutic Exercise: Supine: AAROM shoulder flexion assisting with Lt UE 2-3 sec pause x 10  Scap squeeze 10 sec x 10  Shoulder rhythmic stabilization - flexion/extension; horizontal ab/adduction; circles CW/CCW 15 reps each  Sitting: Supination/pronation 2# 10 x 3 HEP Prone:  Rowing w/ scap squeeze 2-3 sec 2# x 10 x 3 HEP  Shoulder extension w/scap squeeze 2# x 10 x 3 HEP  Sidelying:  Shoulder ER w/scap squeeze 2-3 sec x 15 HEP Standing  UBE L10 x 4 min alternating each min Doorway stretch 3 positions 30 sec x 1  Doorway stretch ball at Lt UE at wrist for stretch into ER in horiz abd 30 sec x 2  Cable row 10# x 10 x 2  Cable triceps 5# x 10 x 2  Farmers carry 5# bilat x 125 ft  Biceps curl 5# x 10 x 3 Hammer curl 5# x 10 x 3  Supination/pronation 3# x 10 x 2 Wall push up x 10 x 2 from lowered surface  Counter plank on elbows 60 sec x 3 Row blue TB 5 sec x 10 x 2 ER blue TB 3 sec x 20 IR blue TB 3 sec x 20 Bodyblade flexion shd ~ 80 deg flexion 1 min x 2  Bodyblade bilat UE shoulder flexion chest to overhead 1 min x 2   Manual Therapy: STM TP release work Lt shoulder  PROM Lt shoulder pt supine shoulder flexion; scaption; ER/IR in scaption; elbow extension within tissue limits with no pain  Neuromuscular re-ed: Working on posture and alignment in standing and sitting  Modalities: Vaso 34 deg low pressure x 10 min  Self Care: Encouraged continued use of ice for home.    [facility] Adult PT Treatment:                                                 DATE: 04/04/23 Therapeutic Exercise: Supine: AAROM shoulder flexion assisting with Lt UE 2-3 sec pause x 10  Scap squeeze 10 sec x 10  Shoulder rhythmic stabilization - flexion/extension; horizontal ab/adduction; circles CW/CCW 15 reps each  Sitting: Supination/pronation 2# 10 x 3  Prone:  Rowing w/ scap squeeze 2-3 sec 2# x 10 x 3 Shoulder extension w/scap squeeze 2# x 10 x 3  Sidelying:  Shoulder ER w/scap squeeze 2-3 sec x 15 Standing  UBE L7 x 4 min alternating each min Doorway  stretch 3 positions 30 sec x 1  Doorway stretch ball at Lt UE at wrist for stretch into ER in horiz abd 30 sec x 2  Biceps curl 5# x 10 x 3 Hammer curl 5# x 10 x 3  Supination/pronation 3# x 10 x 2 Wall push up x 10 x 2 (second set slowly) Wall push up clap 10 x 2  Reverse wall push up x 10 x 2 sets    Counter plank on elbows 60 sec x 3 Supination/pronation red TB x15 each   Row blue TB 5 sec x 10 x 2 Row 45 degrees blue TB x 10  ER green TB 3 sec x 20 IR green TB 3 sec x 20 Bodyblade flexion shd ~ 80 deg flexion 1 min x 2  Bodyblade bilat UE shoulder flexion chest to overhead 1 min x 2  2.5 x 2 suspended on bamboo pole for biceps curl x 10 hands up; x 10 hands down 2.5 x 2 suspended on bamboo pole for overhead press  Rocker board w/airex pad for rocking side to side plank position 30 sec x 2  Manual Therapy: STM TP release work Lt shoulder  PROM Lt shoulder pt supine shoulder flexion; scaption; ER/IR in scaption; elbow extension within tissue limits with no pain  Neuromuscular re-ed: Working on posture and alignment in standing and sitting  Modalities: Vaso 34 deg low pressure x 10 min  Self Care: Encouraged continued use of ice for home.    PATIENT EDUCATION: Education details: POC; HEP  Person educated: Patient Education method: Programmer, multimedia, Demonstration, Actor cues, Verbal cues, and Handouts Education comprehension: verbalized understanding, returned demonstration, verbal cues  required, tactile cues required, and needs further education  HOME EXERCISE PROGRAM:  Access Code: [email or web address] URL: [email or web address] Date: 04/01/2023 Prepared by: Corlis Leak  Exercises - Circular Shoulder Pendulum with Table Support  - 3-4 x daily - 7 x weekly - 1 sets - 20-30 reps - Seated Scapular Retraction  - 2 x daily - 7 x weekly - 1-2 sets - 10 reps - 10 sec  hold - Seated Cervical Retraction  - 2 x daily - 7 x weekly - 1-2 sets - 5-10 reps - 10 sec  hold - Seated Shoulder External Rotation AAROM with Cane and Hand in Neutral  - 2 x daily - 7 x weekly - 1 sets - 5-10 reps - 5-10 sec  hold - Standing 'L' Stretch at [employer]  - 2 x daily - 7 x weekly - 1 sets - 5 reps - 10 sec  hold - Supine Shoulder Flexion AAROM with Hands Clasped  - 2 x daily - 7 x weekly - 1 sets - 5-10 reps - 2-3 sec  hold - Supine Shoulder Rhythmic Stabilization- Flexion/Extension  - 2 x daily - 7 x weekly - 1 sets - 10-15 reps - Prone Shoulder Row  - 2 x daily - 7 x weekly - 1-2 sets - 10 reps - 3 sec  hold - Prone Shoulder Extension - Single Arm  - 2 x daily - 7 x weekly - 1 sets - 10-15 reps - 3 sec  hold - Sidelying Shoulder External Rotation  - 2 x daily - 7 x weekly - 1 sets - 10-15 reps - 3 sec  hold - Standing Isometric Shoulder Extension with Doorway - Arm Bent  - 2 x daily - 7 x weekly - 1 sets - 5-10 reps - 5 sec  hold - Isometric Shoulder Abduction  at Wall  - 2 x daily - 7 x weekly - 1 sets - 5-10 reps - 5 sec  hold - Isometric Shoulder External Rotation at Wall  - 2 x daily - 7 x weekly - 1 sets - 5 reps - 5 sec  hold - Standing Isometric Shoulder Internal Rotation with Towel Roll at Doorway  - 2 x daily - 7 x weekly - 1 sets - 5 reps - 5 sec  hold - Standing Shoulder Row Reactive Isometric  - 2 x daily - 7 x weekly - 1 sets - 10 reps - 30-45 sec  hold - Shoulder External Rotation Reactive Isometrics  - 1 x daily - 7 x weekly - 1 sets - 5-10 reps - 10-30 sec  hold - Shoulder Internal  Rotation Reactive Isometrics  - 2 x daily - 7 x weekly - 1 sets - 10 reps - 3-5 sec  hold - Shoulder External Rotation with Anchored Resistance  - 2 x daily - 7 x weekly - 1-2 sets - 10 reps - 3 sec  hold - Shoulder Internal Rotation with Resistance  - 2 x daily - 7 x weekly - 2 sets - 10 reps - 3 sec  hold - Doorway Pec Stretch at 60 Degrees Abduction  - 3 x daily - 7 x weekly - 1 sets - 3 reps - Doorway Pec Stretch at 90 Degrees Abduction  - 3 x daily - 7 x weekly - 1 sets - 3 reps - 30 seconds  hold - Doorway Pec Stretch at 120 Degrees Abduction  - 3 x daily - 7 x weekly - 1 sets - 3 reps - 30 second hold  hold - Bicep Stretch at Table  - 2 x daily - 7 x weekly - 1 sets - 3 reps - 20-30 sec  hold - Wall Push Up  - 1 x daily - 7 x weekly - 1-3 sets - 10 reps - 3 sec  hold - Plank on Counter  - 2 x daily - 7 x weekly - 1 sets - 3 reps - 30 sec  hold - Forearm Pronation and Supination with Hammer  - 2 x daily - 7 x weekly - 2-3 sets - 10 reps - 5 sec  hold   ASSESSMENT:  CLINICAL IMPRESSION: Pt with improve ROM in ER/IR and much improved FOTO score. He returns to surgeon tomorrow. He is progressing well with strength, will benefit from continued work on endurance and stability  He is s/p Lt shoulder surgery for labral repair and biceps tenodesis 02/12/23.     OBJECTIVE IMPAIRMENTS: poor posture and alignment; limited Lt shoulder ROM, strength and functional activity. He has minimal pain. Sleeping is difficult when out of the recliner. Patient is unable to work or use Lt UE for functional and recreational activities, decreased activity tolerance, decreased mobility, decreased ROM, decreased strength, hypomobility, increased fascial restrictions, impaired flexibility, impaired sensation, impaired UE functional use, improper body mechanics, postural dysfunction, and pain.    GOALS: Goals reviewed with patient? Yes  SHORT TERM GOALS: Target date: 03/28/2023   Independent in initial  HEP Baseline: Goal status: INITIAL  2.  Wean from sling per MD protocol Baseline:  Goal status: INITIAL   LONG TERM GOALS: Target date: 04/24/2023   Improve posture and alignment with patient demonstrating improved upright posture with posterior shoulder girdle engaged  Baseline:  Goal status: INITIAL  2.  AROM Lt shoulder/elbow/forearm =/> than AROM Rt UE  Baseline:  Goal status: INITIAL  3.  4/5 to 5/5 strength Lt shoulder and elbow  Baseline:  Goal status: INITIAL  4.  Return to normal functional activities including return to work with no limitations of Lt UE Baseline:  Goal status: INITIAL  5.  Independent in HEP including aquatic program as indicated  Baseline:  Goal status: INITIAL  6.  Improve functional limitation score to 59 Baseline: 4, 53 (04/16/23) Goal status: IN PROGRESS  PLAN:  PT FREQUENCY: 2x/week  PT DURATION: 8 weeks  PLANNED INTERVENTIONS: Therapeutic exercises, Therapeutic activity, Neuromuscular re-education, Patient/Family education, Self Care, Joint mobilization, Aquatic Therapy, Dry Needling, Electrical stimulation, Spinal mobilization, Cryotherapy, Moist heat, Taping, Vasopneumatic device, Ultrasound, Ionotophoresis 4mg /ml Dexamethasone, Manual therapy, and Re-evaluation  PLAN FOR NEXT SESSION: review and progress exercise; continue with postural correction and education; modalities, manual work, DN as indicated    Erick Murin, PT 04/16/2023, 4:16 PM

## 2023-04-23 ENCOUNTER — Ambulatory Visit: Payer: Managed Care, Other (non HMO) | Admitting: Rehabilitative and Restorative Service Providers"

## 2023-04-23 ENCOUNTER — Encounter: Payer: Self-pay | Admitting: Rehabilitative and Restorative Service Providers"

## 2023-04-23 ENCOUNTER — Encounter: Payer: Self-pay | Admitting: Family Medicine

## 2023-04-23 DIAGNOSIS — M6281 Muscle weakness (generalized): Secondary | ICD-10-CM

## 2023-04-23 DIAGNOSIS — M25512 Pain in left shoulder: Secondary | ICD-10-CM | POA: Diagnosis not present

## 2023-04-23 DIAGNOSIS — R293 Abnormal posture: Secondary | ICD-10-CM

## 2023-04-23 DIAGNOSIS — M25612 Stiffness of left shoulder, not elsewhere classified: Secondary | ICD-10-CM

## 2023-04-23 NOTE — Therapy (Signed)
OUTPATIENT PHYSICAL THERAPY SHOULDER TREATMENT   Patient Name: Richard Long MRN: [id number] DOB:[birth year], 32 y.o., male Today's Date: 04/23/2023  END OF SESSION:  PT End of Session - 04/23/23 1536     Visit Number 11    Number of Visits 24    Date for PT Re-Evaluation 07/02/23    Authorization Type cigna    Authorization Time Period auth required after 5th visit (submitted)    Authorization - Visit Number 11    Authorization - Number of Visits 20    PT Start Time 1536    PT Stop Time 1645    PT Time Calculation (min) 69 min    Activity Tolerance Patient tolerated treatment well              Past Medical History:  Diagnosis Date   Hypertension    Thyroid disease    Past Surgical History:  Procedure Laterality Date   CYST REMOVAL HAND     Patient Active Problem List   Diagnosis Date Noted   Labral tear of shoulder, left, initial encounter 12/11/2022   Diarrhea 06/19/2022   Well adult exam 02/18/2022   Strain of left soleus muscle 08/11/2021   OSA (obstructive sleep apnea) 02/10/2020   Decreased libido 02/10/2020   Strain of right shoulder 10/20/2019   History of thyroid disorder 10/27/2018   Hypertension goal BP (blood pressure) < 130/80 10/27/2018   Acute pain of right shoulder 10/27/2018   Class 2 severe obesity due to excess calories with serious comorbidity in adult (HCC) 10/27/2018   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   Vitamin D deficiency 10/07/2017   Chronic tension-type headache, not intractable 04/06/2016   Pure hypercholesterolemia 04/06/2016    PCP: Dr [name]   REFERRING PROVIDER: Dr Francena Hanly   REFERRING DIAG: Labral tear Lt shoulder   THERAPY DIAG:  Acute pain of left shoulder  Muscle weakness (generalized)  Abnormal posture  Stiffness of left shoulder, not elsewhere classified  Rationale for Evaluation and Treatment: Rehabilitation  ONSET DATE: 02/12/23  SUBJECTIVE:                                                                                                                                                                                       SUBJECTIVE STATEMENT: Patient reports that his elbow and shoulder is doing well. Had a little pain in the biceps last night but it didn't last long. Saw the MD last week. Pleased with progress and increased lifting limit to 10 #.   Eval: Patient reports that he has had Lt shoulder pain for the past 6 months with increased pain 1/24. MRI showed labral tear and  biceps tendon tear. Underwent Lt shoulder score 02/12/23 with no post op complications.  Hand dominance: Ambidextrous; primarily Rt  PERTINENT HISTORY: Rt shoulder injury with labral repair and biceps tenodesis 2020; Lt knee injury/damage; anxiety; HTN  PAIN:  Are you having pain? Yes: NPRS scale: 0/10; no longer wakes in the AM in biceps stiff and tight; less frequent muscle spasms resolve quickly  Pain location: biceps  Pain description: gripping  Aggravating factors: lying flat  Relieving factors: propping   PRECAUTIONS: no heavy lifting overhead; no jerking movements; do not use Lt shoulder in sitting/rising; no isolated biceps for 8 weeks per MD protocol   WEIGHT BEARING RESTRICTIONS: No  FALLS:  Has patient fallen in last 6 months? 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 Superior posterior labral tear with an associated moderate-sized dissecting paralabral cyst back into the spinoglenoid notch. No findings to suggest compression of the suprascapular nerve. Buford complex variant. Mild rotator cuff tendinopathy/tendinosis with small  interstitial tears mainly involving the infraspinatus and supraspinatus tendons. No partial or full-thickness rotator cuff tear. Intact long head biceps tendon.Os acromiale.    PATIENT SURVEYS:  FOTO 4 target 59 FOTO 53 (04/16/23)  POSTURE: Patient presents with head forward posture with increased thoracic kyphosis; shoulders rounded and elevated; scapulae abducted and rotated along the thoracic spine; head of the humerus anterior in orientation.   UPPER EXTREMITY ROM: AROM Rt shoulder assessed in standing; PROM assessed in supine   Active ROM Rt Passive ROM Lt  Right eval Left eval Left  03/19/23 Left  04/23/23  Shoulder flexion 162 155 173 178  Shoulder extension 60 20 64   Shoulder abduction(in scapular plane) 162 150 170 176  Shoulder adduction      Shoulder internal rotation Thumb T7 UE resting at side  60 in scapular plane  70 scapular plane  Shoulder external rotation 119 shoulder 90/elbow 90 degrees  30 degrees scapular plane Elbow flexed  76  In scapular plane elbow flexed  85 scapular plane elbow flexed  Elbow flexion WNL  138 WNL  Elbow extension WNL  -3 WNL  Wrist flexion WNL     Wrist extension WNL     Wrist ulnar deviation WNL     Wrist radial deviation WNL     Wrist pronation WNL     Wrist supination WNL     (Blank rows = not tested)  UPPER EXTREMITY MMT:  MMT Right eval Left eval  Shoulder flexion    Shoulder extension    Shoulder abduction    Shoulder adduction    Shoulder internal rotation    Shoulder external rotation    Middle trapezius    Lower trapezius    Elbow flexion    Elbow extension    Wrist flexion    Wrist extension    Wrist ulnar deviation    Wrist radial deviation    Wrist pronation    Wrist supination    Grip strength (lbs)    (Blank rows = not tested)  PALPATION:  Muscular tightness Lt pecs; upper trap; shoulder girdle musculature   [facility] Adult PT Treatment:  DATE:  04/23/23 Therapeutic Exercise: UBE L10 x 4 min alt every min fwd/bkwd Doorway stretch 30 sec each position Row cable10# x 20 Triceps cable machine 10# x 20 Bicep curl 10# x 30 Hammer curl 6# x 20 Supination/pronation red TB on stair railing 10 x 2  Rocker board on extended arms elevated surface 1.5 min x 2  Farmers carry 5# x 300' Bottoms up KB carry 5# x 375' Hold in shoulder flexion 5#KB x 110' Scaption with opp UE hold 2 x 10 5# Rhythmic stabilization ball on wall 3 x 30 sec Body blade shoulder flexion x 1 min x2  Body blade chest level to over head 1 min x2  Wall push up x 10 slow; x 10 fast push and clap  Ball on wall stretching into shoulder flexion 30 sec x 3  Bouncing ball on wall  Manual Therapy: STM Pecs, Lt shoulder mm IASTM Lt posterior shoulder through the infraspinatus tendon and muscle belly into posterior scapula Modalities:  Vaso 34 deg, low pressure x 10 min Lt shoulder   [facility] Adult PT Treatment:                                                DATE: 04/16/23 Therapeutic Exercise: UBE L10 x 4 min alt every min fwd/bkwd Doorway stretch 30 sec each position Row 10# x 20 Triceps cable machine 5# x 20 Bicep curl 5# x 30 Hammer curl 5# x 30 Farmers carry 5# x 200' Bottoms up KB carry 5# x 110' Hold in shoulder flexion 5#KB x 110' Scaption with opp UE hold 2 x 10 5# Rhythmic stabilization ball on wall 3 x 30 sec Body blade shoulder flexion x 1 min Body blade x 1 min chest level to over head Elevated forearm plank x 1 min Manual Therapy: STM Pecs, Lt shoulder mm  Additional Exercises: UBE L7 x 4 min alternating each min Doorway stretch 3 positions 30 sec x 1  Doorway stretch ball at Lt UE at wrist for stretch into ER in horiz abd 30 sec x 2  Biceps curl 5# x 10 x 3 Hammer curl 5# x 10 x 3  Supination/pronation 3# x 10 x 2 Wall push up x 10 x 2 (second set slowly) Wall push up clap 10 x 2  Reverse wall push up x 10 x 2 sets    Counter plank on elbows 60  sec x 3 Supination/pronation red TB x15 each   Row blue TB 5 sec x 10 x 2 Row 45 degrees blue TB x 10  ER green TB 3 sec x 20 IR green TB 3 sec x 20 Bodyblade flexion shd ~ 80 deg flexion 1 min x 2  Bodyblade bilat UE shoulder flexion chest to overhead 1 min x 2  2.5 x 2 suspended on bamboo pole for biceps curl x 10 hands up; x 10 hands down 2.5 x 2 suspended on bamboo pole for overhead press  Rocker board w/airex pad for rocking side to side plank position 30 sec x 2    PATIENT EDUCATION: Education details: POC; HEP  Person educated: Patient Education method: Programmer, multimedia, Facilities manager, Actor cues, Verbal cues, and Handouts Education comprehension: verbalized understanding, returned demonstration, verbal cues required, tactile cues required, and needs further education  HOME EXERCISE PROGRAM:  Access Code: [email or web address] URL: [email or web address] Date: 04/01/2023 Prepared  by: Corlis Leak  Exercises - Circular Shoulder Pendulum with Table Support  - 3-4 x daily - 7 x weekly - 1 sets - 20-30 reps - Seated Scapular Retraction  - 2 x daily - 7 x weekly - 1-2 sets - 10 reps - 10 sec  hold - Seated Cervical Retraction  - 2 x daily - 7 x weekly - 1-2 sets - 5-10 reps - 10 sec  hold - Seated Shoulder External Rotation AAROM with Cane and Hand in Neutral  - 2 x daily - 7 x weekly - 1 sets - 5-10 reps - 5-10 sec  hold - Standing 'L' Stretch at [employer]  - 2 x daily - 7 x weekly - 1 sets - 5 reps - 10 sec  hold - Supine Shoulder Flexion AAROM with Hands Clasped  - 2 x daily - 7 x weekly - 1 sets - 5-10 reps - 2-3 sec  hold - Supine Shoulder Rhythmic Stabilization- Flexion/Extension  - 2 x daily - 7 x weekly - 1 sets - 10-15 reps - Prone Shoulder Row  - 2 x daily - 7 x weekly - 1-2 sets - 10 reps - 3 sec  hold - Prone Shoulder Extension - Single Arm  - 2 x daily - 7 x weekly - 1 sets - 10-15 reps - 3 sec  hold - Sidelying Shoulder External Rotation  - 2 x daily - 7 x weekly - 1 sets  - 10-15 reps - 3 sec  hold - Standing Isometric Shoulder Extension with Doorway - Arm Bent  - 2 x daily - 7 x weekly - 1 sets - 5-10 reps - 5 sec  hold - Isometric Shoulder Abduction at Wall  - 2 x daily - 7 x weekly - 1 sets - 5-10 reps - 5 sec  hold - Isometric Shoulder External Rotation at Wall  - 2 x daily - 7 x weekly - 1 sets - 5 reps - 5 sec  hold - Standing Isometric Shoulder Internal Rotation with Towel Roll at Doorway  - 2 x daily - 7 x weekly - 1 sets - 5 reps - 5 sec  hold - Standing Shoulder Row Reactive Isometric  - 2 x daily - 7 x weekly - 1 sets - 10 reps - 30-45 sec  hold - Shoulder External Rotation Reactive Isometrics  - 1 x daily - 7 x weekly - 1 sets - 5-10 reps - 10-30 sec  hold - Shoulder Internal Rotation Reactive Isometrics  - 2 x daily - 7 x weekly - 1 sets - 10 reps - 3-5 sec  hold - Shoulder External Rotation with Anchored Resistance  - 2 x daily - 7 x weekly - 1-2 sets - 10 reps - 3 sec  hold - Shoulder Internal Rotation with Resistance  - 2 x daily - 7 x weekly - 2 sets - 10 reps - 3 sec  hold - Doorway Pec Stretch at 60 Degrees Abduction  - 3 x daily - 7 x weekly - 1 sets - 3 reps - Doorway Pec Stretch at 90 Degrees Abduction  - 3 x daily - 7 x weekly - 1 sets - 3 reps - 30 seconds  hold - Doorway Pec Stretch at 120 Degrees Abduction  - 3 x daily - 7 x weekly - 1 sets - 3 reps - 30 second hold  hold - Bicep Stretch at Table  - 2 x daily - 7 x weekly - 1  sets - 3 reps - 20-30 sec  hold - Wall Push Up  - 1 x daily - 7 x weekly - 1-3 sets - 10 reps - 3 sec  hold - Plank on Counter  - 2 x daily - 7 x weekly - 1 sets - 3 reps - 30 sec  hold - Forearm Pronation and Supination with Hammer  - 2 x daily - 7 x weekly - 2-3 sets - 10 reps - 5 sec  hold   ASSESSMENT:  CLINICAL IMPRESSION: Patient continues to demonstrate progress with improving ROM and functional strength. He is using Lt UE for more ADL's and functional activities. Patient has not returned to full duty with  work and remains limited to 10# lifting restriction. He iw progressing well toward rehab goals and has accomplished goals as documented above. He will benefit from continued treatment to accomplish remainder of goals and reach maximum rehab potential. We will continue to work on strength, endurance and stability  He is s/p Lt shoulder surgery for labral repair and biceps tenodesis 02/12/23.     OBJECTIVE IMPAIRMENTS: poor posture and alignment; limited Lt shoulder ROM, strength and functional activity. He has minimal pain. Sleeping is difficult when out of the recliner. Patient is unable to work or use Lt UE for functional and recreational activities, decreased activity tolerance, decreased mobility, decreased ROM, decreased strength, hypomobility, increased fascial restrictions, impaired flexibility, impaired sensation, impaired UE functional use, improper body mechanics, postural dysfunction, and pain.    GOALS: Goals reviewed with patient? Yes  SHORT TERM GOALS: Target date: 03/28/2023   Independent in initial HEP Baseline: Goal status: met  2.  Wean from sling per MD protocol Baseline:  Goal status: met    LONG TERM GOALS: Target date: 07/02/2023   Improve posture and alignment with patient demonstrating improved upright posture with posterior shoulder girdle engaged  Baseline:  Goal status: met   2.  AROM Lt shoulder/elbow/forearm =/> than AROM Rt UE  Baseline:  Goal status: met   3.  4/5 to 5/5 strength Lt shoulder and elbow  Baseline:  Goal status: in progress   4.  Return to normal functional activities including return to work with no limitations of Lt UE Baseline:  Goal status: in progress   5.  Independent in HEP including aquatic program as indicated  Baseline:  Goal status: in progress   6.  Improve functional limitation score to 59 Baseline: 4, 53 (04/16/23) Goal status: IN PROGRESS  PLAN:  PT FREQUENCY: 2x/week  PT DURATION: 8 weeks  PLANNED  INTERVENTIONS: Therapeutic exercises, Therapeutic activity, Neuromuscular re-education, Patient/Family education, Self Care, Joint mobilization, Aquatic Therapy, Dry Needling, Electrical stimulation, Spinal mobilization, Cryotherapy, Moist heat, Taping, Vasopneumatic device, Ultrasound, Ionotophoresis 4mg /ml Dexamethasone, Manual therapy, and Re-evaluation  PLAN FOR NEXT SESSION: review and progress exercise; continue with postural correction and education; modalities, manual work, DN as indicated    [employer], PT 04/23/2023, 4:57 PM

## 2023-04-25 ENCOUNTER — Encounter: Payer: Managed Care, Other (non HMO) | Admitting: Rehabilitative and Restorative Service Providers"

## 2023-04-30 ENCOUNTER — Encounter: Payer: Self-pay | Admitting: Rehabilitative and Restorative Service Providers"

## 2023-04-30 ENCOUNTER — Ambulatory Visit: Payer: Managed Care, Other (non HMO) | Admitting: Rehabilitative and Restorative Service Providers"

## 2023-04-30 DIAGNOSIS — M25512 Pain in left shoulder: Secondary | ICD-10-CM

## 2023-04-30 DIAGNOSIS — R293 Abnormal posture: Secondary | ICD-10-CM

## 2023-04-30 DIAGNOSIS — M25612 Stiffness of left shoulder, not elsewhere classified: Secondary | ICD-10-CM

## 2023-04-30 DIAGNOSIS — M6281 Muscle weakness (generalized): Secondary | ICD-10-CM

## 2023-04-30 NOTE — Therapy (Signed)
OUTPATIENT PHYSICAL THERAPY SHOULDER TREATMENT   Patient Name: Richard Long MRN: [id number] DOB:[birth year], 32 y.o., male Today's Date: 04/30/2023  END OF SESSION:  PT End of Session - 04/30/23 1618     Visit Number 12    Number of Visits 24    Date for PT Re-Evaluation 07/02/23    Authorization Type cigna    Authorization Time Period auth required after 5th visit (submitted)    Authorization - Visit Number 12    Authorization - Number of Visits 20    PT Start Time 1615    PT Stop Time 1703    PT Time Calculation (min) 48 min    Activity Tolerance Patient tolerated treatment well              Past Medical History:  Diagnosis Date   Hypertension    Thyroid disease    Past Surgical History:  Procedure Laterality Date   CYST REMOVAL HAND     Patient Active Problem List   Diagnosis Date Noted   Labral tear of shoulder, left, initial encounter 12/11/2022   Diarrhea 06/19/2022   Well adult exam 02/18/2022   Strain of left soleus muscle 08/11/2021   OSA (obstructive sleep apnea) 02/10/2020   Decreased libido 02/10/2020   Strain of right shoulder 10/20/2019   History of thyroid disorder 10/27/2018   Hypertension goal BP (blood pressure) < 130/80 10/27/2018   Acute pain of right shoulder 10/27/2018   Class 2 severe obesity due to excess calories with serious comorbidity in adult (HCC) 10/27/2018   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   Vitamin D deficiency 10/07/2017   Chronic tension-type headache, not intractable 04/06/2016   Pure hypercholesterolemia 04/06/2016    PCP: Dr [name]   REFERRING PROVIDER: Dr Francena Hanly   REFERRING DIAG: Labral tear Lt shoulder   THERAPY DIAG:  Acute pain of left shoulder  Muscle weakness (generalized)  Abnormal posture  Stiffness of left shoulder, not elsewhere classified  Rationale for Evaluation and Treatment: Rehabilitation  ONSET DATE: 02/12/23  SUBJECTIVE:                                                                                                                                                                                       SUBJECTIVE STATEMENT: Patient reports that his elbow and shoulder is doing well. Pleased with progress and increased lifting limit to 10 #.   Eval: Patient reports that he has had Lt shoulder pain for the past 6 months with increased pain 1/24. MRI showed labral tear and biceps tendon tear. Underwent Lt shoulder score 02/12/23 with no post op complications.  Hand dominance: Ambidextrous; primarily Rt  PERTINENT HISTORY: Rt shoulder injury with labral repair and biceps tenodesis 2020; Lt knee injury/damage; anxiety; HTN  PAIN:  Are you having pain? Yes: NPRS scale: 0/10; no longer wakes in the AM in biceps stiff and tight; less frequent muscle spasms resolve quickly  Pain location: biceps  Pain description: gripping  Aggravating factors: lying flat  Relieving factors: propping   PRECAUTIONS: no heavy lifting overhead; no jerking movements; do not use Lt shoulder in sitting/rising; no isolated biceps for 8 weeks per MD protocol   WEIGHT BEARING RESTRICTIONS: No  FALLS:  Has patient fallen in last 6 months? 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 Superior posterior labral tear with an associated moderate-sized dissecting paralabral cyst back into the spinoglenoid notch. No findings to suggest compression of the suprascapular nerve. Buford complex variant. Mild rotator cuff tendinopathy/tendinosis with small interstitial tears mainly involving the infraspinatus and supraspinatus tendons. No partial or  full-thickness rotator cuff tear. Intact long head biceps tendon.Os acromiale.    PATIENT SURVEYS:  FOTO 4 target 59 FOTO 53 (04/16/23)  POSTURE: Patient presents with head forward posture with increased thoracic kyphosis; shoulders rounded and elevated; scapulae abducted and rotated along the thoracic spine; head of the humerus anterior in orientation.   UPPER EXTREMITY ROM: AROM Rt shoulder assessed in standing; PROM assessed in supine   Active ROM Rt Passive ROM Lt  Right eval Left eval Left  03/19/23 Left  04/23/23  Shoulder flexion 162 155 173 178  Shoulder extension 60 20 64   Shoulder abduction(in scapular plane) 162 150 170 176  Shoulder adduction      Shoulder internal rotation Thumb T7 UE resting at side  60 in scapular plane  70 scapular plane  Shoulder external rotation 119 shoulder 90/elbow 90 degrees  30 degrees scapular plane Elbow flexed  76  In scapular plane elbow flexed  85 scapular plane elbow flexed  Elbow flexion WNL  138 WNL  Elbow extension WNL  -3 WNL  Wrist flexion WNL     Wrist extension WNL     Wrist ulnar deviation WNL     Wrist radial deviation WNL     Wrist pronation WNL     Wrist supination WNL     (Blank rows = not tested)  UPPER EXTREMITY MMT:  MMT Right eval Left eval  Shoulder flexion    Shoulder extension    Shoulder abduction    Shoulder adduction    Shoulder internal rotation    Shoulder external rotation    Middle trapezius    Lower trapezius    Elbow flexion    Elbow extension    Wrist flexion    Wrist extension    Wrist ulnar deviation    Wrist radial deviation    Wrist pronation    Wrist supination    Grip strength (lbs)    (Blank rows = not tested)  PALPATION:  Muscular tightness Lt pecs; upper trap; shoulder girdle musculature    [facility] Adult PT Treatment:  DATE: 04/30/23 Therapeutic Exercise: UBE L10 x 4 min alt every min fwd/bkwd Doorway stretch 30 sec each  position Row cable10# x 20 (from lower position #2)  Row cable 10# x 20 Triceps cable machine 10# x 20 Bicep curl 10# x 10 x 2  Hammer curl 10# x 15 x 1; 5 x 1  Supination/pronation red TB on stair railing 10 x 2  Rocker board on extended arms elevated surface 1.5 min x 2  Farmers carry 10# bilat x 300' Bottoms up KB carry 5# bilat x 375' Shoulder flexion 5# x 10 x 2  Scaption 5# x 10 x 2 Body blade shoulder flexion x 1 min x2  Body blade chest level to over head 1 min x2  Manual Therapy: STM Pecs, Lt shoulder mm Skilled palpation to assess response to DN and manual work Production designer, theatre/television/film Dry-Needling  Treatment instructions: Expect mild to moderate muscle soreness. S/S of pneumothorax if dry needled over a lung field, and to seek immediate medical attention should they occur. Patient verbalized understanding of these instructions and education.  Patient Consent Given: Yes Education handout provided: Previously provided Muscles treated: Lt infraspinatus  Electrical stimulation performed: No Parameters: N/A Treatment response/outcome: decrease palpable tightness; increased mobility/ROM  Modalities:  Moist heat 8 min Lt shoulder   [facility] Adult PT Treatment:                                                DATE: 04/23/23 Therapeutic Exercise: UBE L10 x 4 min alt every min fwd/bkwd Doorway stretch 30 sec each position Row cable10# x 20 Triceps cable machine 10# x 20 Bicep curl 10# x 30 Hammer curl 6# x 20 Supination/pronation red TB on stair railing 10 x 2  Rocker board on extended arms elevated surface 1.5 min x 2  Farmers carry 5# x 300' Bottoms up KB carry 5# x 375' Hold in shoulder flexion 5#KB x 110' Scaption with opp UE hold 2 x 10 5# Rhythmic stabilization ball on wall 3 x 30 sec Body blade shoulder flexion x 1 min x2  Body blade chest level to over head 1 min x2  Wall push up x 10 slow; x 10 fast push and clap  Ball on wall stretching into shoulder flexion 30  sec x 3  Bouncing ball on wall  Manual Therapy: STM Pecs, Lt shoulder mm IASTM Lt posterior shoulder through the infraspinatus tendon and muscle belly into posterior scapula Modalities:  Vaso 34 deg, low pressure x 10 min Lt shoulder    Additional Exercises: UBE L7 x 4 min alternating each min Doorway stretch 3 positions 30 sec x 1  Doorway stretch ball at Lt UE at wrist for stretch into ER in horiz abd 30 sec x 2  Biceps curl 5# x 10 x 3 Hammer curl 5# x 10 x 3  Supination/pronation 3# x 10 x 2 Wall push up x 10 x 2 (second set slowly) Wall push up clap 10 x 2  Reverse wall push up x 10 x 2 sets    Counter plank on elbows 60 sec x 3 Supination/pronation red TB x15 each   Row blue TB 5 sec x 10 x 2 Row 45 degrees blue TB x 10  ER green TB 3 sec x 20 IR green TB 3 sec x 20 Bodyblade  flexion shd ~ 80 deg flexion 1 min x 2  Bodyblade bilat UE shoulder flexion chest to overhead 1 min x 2  2.5 x 2 suspended on bamboo pole for biceps curl x 10 hands up; x 10 hands down 2.5 x 2 suspended on bamboo pole for overhead press  Rocker board w/airex pad for rocking side to side plank position 30 sec x 2    PATIENT EDUCATION: Education details: POC; HEP  Person educated: Patient Education method: Programmer, multimedia, Facilities manager, Actor cues, Verbal cues, and Handouts Education comprehension: verbalized understanding, returned demonstration, verbal cues required, tactile cues required, and needs further education  HOME EXERCISE PROGRAM:  Access Code: [email or web address] URL: [email or web address] Date: 04/01/2023 Prepared by: Corlis Leak  Exercises - Circular Shoulder Pendulum with Table Support  - 3-4 x daily - 7 x weekly - 1 sets - 20-30 reps - Seated Scapular Retraction  - 2 x daily - 7 x weekly - 1-2 sets - 10 reps - 10 sec  hold - Seated Cervical Retraction  - 2 x daily - 7 x weekly - 1-2 sets - 5-10 reps - 10 sec  hold - Seated Shoulder External Rotation AAROM with Cane and Hand  in Neutral  - 2 x daily - 7 x weekly - 1 sets - 5-10 reps - 5-10 sec  hold - Standing 'L' Stretch at [employer]  - 2 x daily - 7 x weekly - 1 sets - 5 reps - 10 sec  hold - Supine Shoulder Flexion AAROM with Hands Clasped  - 2 x daily - 7 x weekly - 1 sets - 5-10 reps - 2-3 sec  hold - Supine Shoulder Rhythmic Stabilization- Flexion/Extension  - 2 x daily - 7 x weekly - 1 sets - 10-15 reps - Prone Shoulder Row  - 2 x daily - 7 x weekly - 1-2 sets - 10 reps - 3 sec  hold - Prone Shoulder Extension - Single Arm  - 2 x daily - 7 x weekly - 1 sets - 10-15 reps - 3 sec  hold - Sidelying Shoulder External Rotation  - 2 x daily - 7 x weekly - 1 sets - 10-15 reps - 3 sec  hold - Standing Isometric Shoulder Extension with Doorway - Arm Bent  - 2 x daily - 7 x weekly - 1 sets - 5-10 reps - 5 sec  hold - Isometric Shoulder Abduction at Wall  - 2 x daily - 7 x weekly - 1 sets - 5-10 reps - 5 sec  hold - Isometric Shoulder External Rotation at Wall  - 2 x daily - 7 x weekly - 1 sets - 5 reps - 5 sec  hold - Standing Isometric Shoulder Internal Rotation with Towel Roll at Doorway  - 2 x daily - 7 x weekly - 1 sets - 5 reps - 5 sec  hold - Standing Shoulder Row Reactive Isometric  - 2 x daily - 7 x weekly - 1 sets - 10 reps - 30-45 sec  hold - Shoulder External Rotation Reactive Isometrics  - 1 x daily - 7 x weekly - 1 sets - 5-10 reps - 10-30 sec  hold - Shoulder Internal Rotation Reactive Isometrics  - 2 x daily - 7 x weekly - 1 sets - 10 reps - 3-5 sec  hold - Shoulder External Rotation with Anchored Resistance  - 2 x daily - 7 x weekly - 1-2 sets - 10 reps - 3 sec  hold - Shoulder  Internal Rotation with Resistance  - 2 x daily - 7 x weekly - 2 sets - 10 reps - 3 sec  hold - Doorway Pec Stretch at 60 Degrees Abduction  - 3 x daily - 7 x weekly - 1 sets - 3 reps - Doorway Pec Stretch at 90 Degrees Abduction  - 3 x daily - 7 x weekly - 1 sets - 3 reps - 30 seconds  hold - Doorway Pec Stretch at 120 Degrees  Abduction  - 3 x daily - 7 x weekly - 1 sets - 3 reps - 30 second hold  hold - Bicep Stretch at Table  - 2 x daily - 7 x weekly - 1 sets - 3 reps - 20-30 sec  hold - Wall Push Up  - 1 x daily - 7 x weekly - 1-3 sets - 10 reps - 3 sec  hold - Plank on Counter  - 2 x daily - 7 x weekly - 1 sets - 3 reps - 30 sec  hold - Forearm Pronation and Supination with Hammer  - 2 x daily - 7 x weekly - 2-3 sets - 10 reps - 5 sec  hold   ASSESSMENT:  CLINICAL IMPRESSION: Patient continues to demonstrate progress with improving ROM and functional strength. He is using Lt UE for more ADL's and functional activities. Patient has not returned to full duty with work and remains limited to 10# lifting restriction. We will continue to work on strength, endurance and stability; trial of dry needling Lt infraspinatus tolerated well - one needle - with decreased palpable tightness and increased freedom of movement.  Trial of BFR   He is s/p Lt shoulder surgery for labral repair and biceps tenodesis 02/12/23.     OBJECTIVE IMPAIRMENTS: poor posture and alignment; limited Lt shoulder ROM, strength and functional activity. He has minimal pain. Sleeping is difficult when out of the recliner. Patient is unable to work or use Lt UE for functional and recreational activities, decreased activity tolerance, decreased mobility, decreased ROM, decreased strength, hypomobility, increased fascial restrictions, impaired flexibility, impaired sensation, impaired UE functional use, improper body mechanics, postural dysfunction, and pain.    GOALS: Goals reviewed with patient? Yes  SHORT TERM GOALS: Target date: 03/28/2023   Independent in initial HEP Baseline: Goal status: met  2.  Wean from sling per MD protocol Baseline:  Goal status: met    LONG TERM GOALS: Target date: 07/02/2023   Improve posture and alignment with patient demonstrating improved upright posture with posterior shoulder girdle engaged  Baseline:  Goal  status: met   2.  AROM Lt shoulder/elbow/forearm =/> than AROM Rt UE  Baseline:  Goal status: met   3.  4/5 to 5/5 strength Lt shoulder and elbow  Baseline:  Goal status: in progress   4.  Return to normal functional activities including return to work with no limitations of Lt UE Baseline:  Goal status: in progress   5.  Independent in HEP including aquatic program as indicated  Baseline:  Goal status: in progress   6.  Improve functional limitation score to 59 Baseline: 4, 53 (04/16/23) Goal status: IN PROGRESS  PLAN:  PT FREQUENCY: 2x/week  PT DURATION: 8 weeks  PLANNED INTERVENTIONS: Therapeutic exercises, Therapeutic activity, Neuromuscular re-education, Patient/Family education, Self Care, Joint mobilization, Aquatic Therapy, Dry Needling, Electrical stimulation, Spinal mobilization, Cryotherapy, Moist heat, Taping, Vasopneumatic device, Ultrasound, Ionotophoresis 4mg /ml Dexamethasone, Manual therapy, and Re-evaluation  PLAN FOR NEXT SESSION: review and progress exercise;  continue with postural correction and education; modalities, manual work, DN as indicated    [employer], PT 04/30/2023, 4:19 PM

## 2023-05-02 ENCOUNTER — Ambulatory Visit: Payer: Managed Care, Other (non HMO) | Admitting: Physical Therapy

## 2023-05-02 ENCOUNTER — Encounter: Payer: Self-pay | Admitting: Physical Therapy

## 2023-05-02 DIAGNOSIS — M25512 Pain in left shoulder: Secondary | ICD-10-CM

## 2023-05-02 DIAGNOSIS — R293 Abnormal posture: Secondary | ICD-10-CM

## 2023-05-02 DIAGNOSIS — M6281 Muscle weakness (generalized): Secondary | ICD-10-CM

## 2023-05-02 DIAGNOSIS — M25612 Stiffness of left shoulder, not elsewhere classified: Secondary | ICD-10-CM

## 2023-05-02 NOTE — Therapy (Signed)
OUTPATIENT PHYSICAL THERAPY SHOULDER TREATMENT   Patient Name: Richard Long MRN: [id number] DOB:11/17/1991, 32 y.o., male Today's Date: 05/02/2023  END OF SESSION:  PT End of Session - 05/02/23 1532     Visit Number 13    Number of Visits 24    Date for PT Re-Evaluation 07/02/23    Authorization Type cigna    Authorization Time Period auth required after 5th visit (submitted)    Authorization - Number of Visits 20    PT Start Time 1532    PT Stop Time 1615    PT Time Calculation (min) 43 min    Activity Tolerance Patient tolerated treatment well              Past Medical History:  Diagnosis Date   Hypertension    Thyroid disease    Past Surgical History:  Procedure Laterality Date   CYST REMOVAL HAND     Patient Active Problem List   Diagnosis Date Noted   Labral tear of shoulder, left, initial encounter 12/11/2022   Diarrhea 06/19/2022   Well adult exam 02/18/2022   Strain of left soleus muscle 08/11/2021   OSA (obstructive sleep apnea) 02/10/2020   Decreased libido 02/10/2020   Strain of right shoulder 10/20/2019   History of thyroid disorder 10/27/2018   Hypertension goal BP (blood pressure) < 130/80 10/27/2018   Acute pain of right shoulder 10/27/2018   Class 2 severe obesity due to excess calories with serious comorbidity in adult (HCC) 10/27/2018   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   Vitamin D deficiency 10/07/2017   Chronic tension-type headache, not intractable 04/06/2016   Pure hypercholesterolemia 04/06/2016    PCP: Dr [name]   REFERRING PROVIDER: Dr Francena Hanly   REFERRING DIAG: Labral tear Lt shoulder   THERAPY DIAG:  Acute pain of left shoulder  Muscle weakness (generalized)  Abnormal posture  Stiffness of left shoulder, not elsewhere classified  Rationale for Evaluation and Treatment: Rehabilitation  ONSET DATE: 02/12/23  SUBJECTIVE:                                                                                                                                                                                       SUBJECTIVE STATEMENT: Pt states no problems and issues. Has been sore.   Eval: Patient reports that he has had Lt shoulder pain for the past 6 months with increased pain 1/24. MRI showed labral tear and biceps tendon tear. Underwent Lt shoulder score 02/12/23 with no post op complications.  Hand dominance: Ambidextrous; primarily Rt  PERTINENT HISTORY: Rt shoulder injury with labral repair and biceps tenodesis 2020; Lt knee injury/damage; anxiety; HTN  PAIN:  Are you having pain? Yes: NPRS scale: 0/10; no longer wakes in the AM in biceps stiff and tight; less frequent muscle spasms resolve quickly  Pain location: biceps  Pain description: gripping  Aggravating factors: lying flat  Relieving factors: propping   PRECAUTIONS: no heavy lifting overhead; no jerking movements; do not use Lt shoulder in sitting/rising; no isolated biceps for 8 weeks per MD protocol   WEIGHT BEARING RESTRICTIONS: No  FALLS:  Has patient fallen in last 6 months? 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 Superior posterior labral tear with an associated moderate-sized dissecting paralabral cyst back into the spinoglenoid notch. No findings to suggest compression of the suprascapular nerve. Buford complex variant. Mild rotator cuff tendinopathy/tendinosis with small interstitial tears mainly involving the infraspinatus and supraspinatus tendons. No partial or full-thickness rotator cuff tear. Intact long head biceps tendon.Os acromiale.    PATIENT SURVEYS:   FOTO 4 target 59 FOTO 53 (04/16/23)  POSTURE: Patient presents with head forward posture with increased thoracic kyphosis; shoulders rounded and elevated; scapulae abducted and rotated along the thoracic spine; head of the humerus anterior in orientation.   UPPER EXTREMITY ROM: AROM Rt shoulder assessed in standing; PROM assessed in supine   Active ROM Rt Passive ROM Lt  Right eval Left eval Left  03/19/23 Left  04/23/23  Shoulder flexion 162 155 173 178  Shoulder extension 60 20 64   Shoulder abduction(in scapular plane) 162 150 170 176  Shoulder adduction      Shoulder internal rotation Thumb T7 UE resting at side  60 in scapular plane  70 scapular plane  Shoulder external rotation 119 shoulder 90/elbow 90 degrees  30 degrees scapular plane Elbow flexed  76  In scapular plane elbow flexed  85 scapular plane elbow flexed  Elbow flexion WNL  138 WNL  Elbow extension WNL  -3 WNL  Wrist flexion WNL     Wrist extension WNL     Wrist ulnar deviation WNL     Wrist radial deviation WNL     Wrist pronation WNL     Wrist supination WNL     (Blank rows = not tested)  UPPER EXTREMITY MMT:  MMT Right eval Left eval  Shoulder flexion    Shoulder extension    Shoulder abduction    Shoulder adduction    Shoulder internal rotation    Shoulder external rotation    Middle trapezius    Lower trapezius    Elbow flexion    Elbow extension    Wrist flexion    Wrist extension    Wrist ulnar deviation    Wrist radial deviation    Wrist pronation    Wrist supination    Grip strength (lbs)    (Blank rows = not tested)  PALPATION:  Muscular tightness Lt pecs; upper trap; shoulder girdle musculature    [facility] Adult PT Treatment:  DATE: 05/02/23 Therapeutic Exercise: UBE L10 X 4 min alt every min fwd/bwd BFR 190 mmhg UOP,  50% LOP = 95 mmHg Bicep curl x30, 15, 15, 15 with 5# LOP = 100 mmHg Hammer curl x30, 15, 15, 15 with 5# LOP = 100  mmHg tricep cable machine 5# x30, 15, 15, 15 LOP = 100 mmHg overhead tricep 5# x30, 15, 15, 15 LOP = 100 mmHg reactive iso shoulder ER 5# x30, 15, 15, 15 LOP = 100 mmHg reactive iso shoulder IR 5# x30, 15, 15, 15   [facility] Adult PT Treatment:                                                DATE: 04/30/23 Therapeutic Exercise: UBE L10 x 4 min alt every min fwd/bkwd Doorway stretch 30 sec each position Row cable10# x 20 (from lower position #2)  Row cable 10# x 20 Triceps cable machine 10# x 20 Bicep curl 10# x 10 x 2  Hammer curl 10# x 15 x 1; 5 x 1  Supination/pronation red TB on stair railing 10 x 2  Rocker board on extended arms elevated surface 1.5 min x 2  Farmers carry 10# bilat x 300' Bottoms up KB carry 5# bilat x 375' Shoulder flexion 5# x 10 x 2  Scaption 5# x 10 x 2 Body blade shoulder flexion x 1 min x2  Body blade chest level to over head 1 min x2  Manual Therapy: STM Pecs, Lt shoulder mm Skilled palpation to assess response to DN and manual work Production designer, theatre/television/film Dry-Needling  Treatment instructions: Expect mild to moderate muscle soreness. S/S of pneumothorax if dry needled over a lung field, and to seek immediate medical attention should they occur. Patient verbalized understanding of these instructions and education.  Patient Consent Given: Yes Education handout provided: Previously provided Muscles treated: Lt infraspinatus  Electrical stimulation performed: No Parameters: N/A Treatment response/outcome: decrease palpable tightness; increased mobility/ROM Modalities:  Moist heat 8 min Lt shoulder    PATIENT EDUCATION: Education details: POC; HEP  Person educated: Patient Education method: Programmer, multimedia, Demonstration, Actor cues, Verbal cues, and Handouts Education comprehension: verbalized understanding, returned demonstration, verbal cues required, tactile cues required, and needs further education  HOME EXERCISE PROGRAM:  Access Code:  [email or web address] URL: [email or web address] Date: 04/01/2023 Prepared by: Corlis Leak  Exercises - Circular Shoulder Pendulum with Table Support  - 3-4 x daily - 7 x weekly - 1 sets - 20-30 reps - Seated Scapular Retraction  - 2 x daily - 7 x weekly - 1-2 sets - 10 reps - 10 sec  hold - Seated Cervical Retraction  - 2 x daily - 7 x weekly - 1-2 sets - 5-10 reps - 10 sec  hold - Seated Shoulder External Rotation AAROM with Cane and Hand in Neutral  - 2 x daily - 7 x weekly - 1 sets - 5-10 reps - 5-10 sec  hold - Standing 'L' Stretch at [employer]  - 2 x daily - 7 x weekly - 1 sets - 5 reps - 10 sec  hold - Supine Shoulder Flexion AAROM with Hands Clasped  - 2 x daily - 7 x weekly - 1 sets - 5-10 reps - 2-3 sec  hold - Supine Shoulder Rhythmic Stabilization- Flexion/Extension  - 2 x daily - 7 x weekly - 1  sets - 10-15 reps - Prone Shoulder Row  - 2 x daily - 7 x weekly - 1-2 sets - 10 reps - 3 sec  hold - Prone Shoulder Extension - Single Arm  - 2 x daily - 7 x weekly - 1 sets - 10-15 reps - 3 sec  hold - Sidelying Shoulder External Rotation  - 2 x daily - 7 x weekly - 1 sets - 10-15 reps - 3 sec  hold - Standing Isometric Shoulder Extension with Doorway - Arm Bent  - 2 x daily - 7 x weekly - 1 sets - 5-10 reps - 5 sec  hold - Isometric Shoulder Abduction at Wall  - 2 x daily - 7 x weekly - 1 sets - 5-10 reps - 5 sec  hold - Isometric Shoulder External Rotation at Wall  - 2 x daily - 7 x weekly - 1 sets - 5 reps - 5 sec  hold - Standing Isometric Shoulder Internal Rotation with Towel Roll at Doorway  - 2 x daily - 7 x weekly - 1 sets - 5 reps - 5 sec  hold - Standing Shoulder Row Reactive Isometric  - 2 x daily - 7 x weekly - 1 sets - 10 reps - 30-45 sec  hold - Shoulder External Rotation Reactive Isometrics  - 1 x daily - 7 x weekly - 1 sets - 5-10 reps - 10-30 sec  hold - Shoulder Internal Rotation Reactive Isometrics  - 2 x daily - 7 x weekly - 1 sets - 10 reps - 3-5 sec  hold - Shoulder  External Rotation with Anchored Resistance  - 2 x daily - 7 x weekly - 1-2 sets - 10 reps - 3 sec  hold - Shoulder Internal Rotation with Resistance  - 2 x daily - 7 x weekly - 2 sets - 10 reps - 3 sec  hold - Doorway Pec Stretch at 60 Degrees Abduction  - 3 x daily - 7 x weekly - 1 sets - 3 reps - Doorway Pec Stretch at 90 Degrees Abduction  - 3 x daily - 7 x weekly - 1 sets - 3 reps - 30 seconds  hold - Doorway Pec Stretch at 120 Degrees Abduction  - 3 x daily - 7 x weekly - 1 sets - 3 reps - 30 second hold  hold - Bicep Stretch at Table  - 2 x daily - 7 x weekly - 1 sets - 3 reps - 20-30 sec  hold - Wall Push Up  - 1 x daily - 7 x weekly - 1-3 sets - 10 reps - 3 sec  hold - Plank on Counter  - 2 x daily - 7 x weekly - 1 sets - 3 reps - 30 sec  hold - Forearm Pronation and Supination with Hammer  - 2 x daily - 7 x weekly - 2-3 sets - 10 reps - 5 sec  hold   ASSESSMENT:  CLINICAL IMPRESSION: Performed BFR this session with exercises. Tolerated well.   He is s/p Lt shoulder surgery for labral repair and biceps tenodesis 02/12/23.     OBJECTIVE IMPAIRMENTS: poor posture and alignment; limited Lt shoulder ROM, strength and functional activity. He has minimal pain. Sleeping is difficult when out of the recliner. Patient is unable to work or use Lt UE for functional and recreational activities, decreased activity tolerance, decreased mobility, decreased ROM, decreased strength, hypomobility, increased fascial restrictions, impaired flexibility, impaired sensation, impaired UE functional use,  improper body mechanics, postural dysfunction, and pain.    GOALS: Goals reviewed with patient? Yes  SHORT TERM GOALS: Target date: 03/28/2023   Independent in initial HEP Baseline: Goal status: met  2.  Wean from sling per MD protocol Baseline:  Goal status: met    LONG TERM GOALS: Target date: 07/02/2023   Improve posture and alignment with patient demonstrating improved upright posture with  posterior shoulder girdle engaged  Baseline:  Goal status: met   2.  AROM Lt shoulder/elbow/forearm =/> than AROM Rt UE  Baseline:  Goal status: met   3.  4/5 to 5/5 strength Lt shoulder and elbow  Baseline:  Goal status: in progress   4.  Return to normal functional activities including return to work with no limitations of Lt UE Baseline:  Goal status: in progress   5.  Independent in HEP including aquatic program as indicated  Baseline:  Goal status: in progress   6.  Improve functional limitation score to 59 Baseline: 4, 53 (04/16/23) Goal status: IN PROGRESS  PLAN:  PT FREQUENCY: 2x/week  PT DURATION: 8 weeks  PLANNED INTERVENTIONS: Therapeutic exercises, Therapeutic activity, Neuromuscular re-education, Patient/Family education, Self Care, Joint mobilization, Aquatic Therapy, Dry Needling, Electrical stimulation, Spinal mobilization, Cryotherapy, Moist heat, Taping, Vasopneumatic device, Ultrasound, Ionotophoresis 4mg /ml Dexamethasone, Manual therapy, and Re-evaluation  PLAN FOR NEXT SESSION: review and progress exercise; continue with postural correction and education; modalities, manual work, DN as indicated    Lorena Benham April Ma L [place], PT 05/02/2023, 3:32 PM

## 2023-05-07 ENCOUNTER — Ambulatory Visit
Payer: Managed Care, Other (non HMO) | Attending: Orthopedic Surgery | Admitting: Rehabilitative and Restorative Service Providers"

## 2023-05-07 DIAGNOSIS — M25512 Pain in left shoulder: Secondary | ICD-10-CM | POA: Diagnosis present

## 2023-05-07 DIAGNOSIS — R293 Abnormal posture: Secondary | ICD-10-CM | POA: Insufficient documentation

## 2023-05-07 DIAGNOSIS — M25612 Stiffness of left shoulder, not elsewhere classified: Secondary | ICD-10-CM | POA: Diagnosis present

## 2023-05-07 DIAGNOSIS — M6281 Muscle weakness (generalized): Secondary | ICD-10-CM | POA: Insufficient documentation

## 2023-05-07 NOTE — Therapy (Signed)
OUTPATIENT PHYSICAL THERAPY SHOULDER TREATMENT   Patient Name: Richard Long MRN: [id number] DOB:1991-11-10, 32 y.o., male Today's Date: 05/07/2023  END OF SESSION:  PT End of Session - 05/07/23 1451     Visit Number 14    Number of Visits 24    Date for PT Re-Evaluation 07/02/23    Authorization Type cigna    Authorization Time Period auth required after 5th visit (submitted)    Authorization - Visit Number 14    Authorization - Number of Visits 20    PT Start Time 1451    PT Stop Time 1540    PT Time Calculation (min) 49 min    Activity Tolerance Patient tolerated treatment well              Past Medical History:  Diagnosis Date   Hypertension    Thyroid disease    Past Surgical History:  Procedure Laterality Date   CYST REMOVAL HAND     Patient Active Problem List   Diagnosis Date Noted   Labral tear of shoulder, left, initial encounter 12/11/2022   Diarrhea 06/19/2022   Well adult exam 02/18/2022   Strain of left soleus muscle 08/11/2021   OSA (obstructive sleep apnea) 02/10/2020   Decreased libido 02/10/2020   Strain of right shoulder 10/20/2019   History of thyroid disorder 10/27/2018   Hypertension goal BP (blood pressure) < 130/80 10/27/2018   Acute pain of right shoulder 10/27/2018   Class 2 severe obesity due to excess calories with serious comorbidity in adult (HCC) 10/27/2018   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   Vitamin D deficiency 10/07/2017   Chronic tension-type headache, not intractable 04/06/2016   Pure hypercholesterolemia 04/06/2016    PCP: Dr [name]   REFERRING PROVIDER: Dr Francena Hanly   REFERRING DIAG: Labral tear Lt shoulder   THERAPY DIAG:  Acute pain of left shoulder  Muscle weakness (generalized)  Abnormal posture  Stiffness of left shoulder, not elsewhere classified  Rationale for Evaluation and Treatment: Rehabilitation  ONSET DATE: 02/12/23  SUBJECTIVE:                                                                                                                                                                                       SUBJECTIVE STATEMENT: Pt reports that Rt shoulder started hurting the day following last visit. He is having some pain in the front of the shoulder  on an intermittent basis. Not severe, but notices discomfort.   Eval: Patient reports that he has had Lt shoulder pain for the past 6 months with increased pain 1/24. MRI showed labral tear and biceps tendon tear. Underwent Lt shoulder  score 02/12/23 with no post op complications.  Hand dominance: Ambidextrous; primarily Rt  PERTINENT HISTORY: Rt shoulder injury with labral repair and biceps tenodesis 2020; Lt knee injury/damage; anxiety; HTN  PAIN:  Are you having pain? Yes: NPRS scale: 1/10; at worst 4/10 with hedge clippers   Pain location: biceps  Pain description: dull aching   Aggravating factors: end range ER shoulder at 90/elbow 90; leaning on Lt elbow  Relieving factors: relaxing arm and rolling shoulder back; OTC meds   PRECAUTIONS: no heavy lifting overhead; no jerking movements; do not use Lt shoulder in sitting/rising; no isolated biceps for 8 weeks per MD protocol   WEIGHT BEARING RESTRICTIONS: No  FALLS:  Has patient fallen in last 6 months? 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 Superior posterior labral tear with an associated moderate-sized dissecting paralabral cyst back into the spinoglenoid notch. No findings to suggest compression of the suprascapular nerve. Buford complex variant. Mild rotator cuff tendinopathy/tendinosis with  small interstitial tears mainly involving the infraspinatus and supraspinatus tendons. No partial or full-thickness rotator cuff tear. Intact long head biceps tendon.Os acromiale.    PATIENT SURVEYS:  FOTO 4 target 59 FOTO 53 (04/16/23)  POSTURE: Patient presents with head forward posture with increased thoracic kyphosis; shoulders rounded and elevated; scapulae abducted and rotated along the thoracic spine; head of the humerus anterior in orientation.   UPPER EXTREMITY ROM: AROM Rt shoulder assessed in standing; PROM assessed in supine   Active ROM Rt Passive ROM Lt  Right eval Left eval Left  03/19/23 Left  04/23/23 Left  05/07/23  Shoulder flexion 162 155 173 178 172  Shoulder extension 60 20 64  60  Shoulder abduction(in scapular plane) 162 150 170 176 174  Shoulder adduction       Shoulder internal rotation Thumb T7 UE resting at side  60 in scapular plane  70 scapular plane 70 scapular plane  Shoulder external rotation 119 shoulder 90/elbow 90 degrees  30 degrees scapular plane Elbow flexed  76  In scapular plane elbow flexed  85 scapular plane elbow flexed 85 scapular plane elbow flexed  Elbow flexion WNL  138 WNL   Elbow extension WNL  -3 WNL   Wrist flexion WNL      Wrist extension WNL      Wrist ulnar deviation WNL      Wrist radial deviation WNL      Wrist pronation WNL      Wrist supination WNL      (Blank rows = not tested)  UPPER EXTREMITY MMT:   05/07/23: Manual resistive testing biceps and biceps brachii sub max - in tact no pain with light resistive testing   MMT Right eval Left eval  Shoulder flexion    Shoulder extension    Shoulder abduction    Shoulder adduction    Shoulder internal rotation    Shoulder external rotation    Middle trapezius    Lower trapezius    Elbow flexion    Elbow extension    Wrist flexion    Wrist extension    Wrist ulnar deviation  Wrist radial deviation    Wrist pronation    Wrist supination    Grip strength (lbs)     (Blank rows = not tested)  PALPATION:  Muscular tightness Lt pecs; upper trap; shoulder girdle musculature  05/07/23: muscular tightness and banding along the Lt biceps tendon into the biceps musculature; pecs  [facility] Adult PT Treatment:                                                DATE: 05/07/23 Therapeutic Exercise: UBE L4  x 4 min alt every min fwd/bkwd AROM Lt shoulder in standing  Gentle biceps stretch standing 20 sec x 2  Manual Therapy: STM Pecs, Lt shoulder mm IASTM Lt anterior shoulder through the biceps and pecs  Skilled palpation to assess response to DN and manual work Production designer, theatre/television/film Dry-Needling  Treatment instructions: Expect mild to moderate muscle soreness. S/S of pneumothorax if dry needled over a lung field, and to seek immediate medical attention should they occur. Patient verbalized understanding of these instructions and education.  Patient Consent Given: Yes Education handout provided: Previously provided Muscles treated: Lt biceps   Electrical stimulation performed: No Parameters: N/A Treatment response/outcome: decrease palpable tightness; increased mobility/ROM Modalities:  Vaso medium pressure; 34 deg x 15 min   [facility] Adult PT Treatment:                                                DATE: 05/02/23 Therapeutic Exercise: UBE L10 X 4 min alt every min fwd/bwd BFR 190 mmhg UOP,  50% LOP = 95 mmHg Bicep curl x30, 15, 15, 15 with 5# LOP = 100 mmHg Hammer curl x30, 15, 15, 15 with 5# LOP = 100 mmHg tricep cable machine 5# x30, 15, 15, 15 LOP = 100 mmHg overhead tricep 5# x30, 15, 15, 15 LOP = 100 mmHg reactive iso shoulder ER 5# x30, 15, 15, 15 LOP = 100 mmHg reactive iso shoulder IR 5# x30, 15, 15, 15   [facility] Adult PT Treatment:                                                DATE: 04/30/23 Therapeutic Exercise: UBE L10 x 4 min alt every min fwd/bkwd Doorway stretch 30 sec each position Row cable10# x 20 (from lower position #2)  Row cable 10# x  20 Triceps cable machine 10# x 20 Bicep curl 10# x 10 x 2  Hammer curl 10# x 15 x 1; 5 x 1  Supination/pronation red TB on stair railing 10 x 2  Rocker board on extended arms elevated surface 1.5 min x 2  Farmers carry 10# bilat x 300' Bottoms up KB carry 5# bilat x 375' Shoulder flexion 5# x 10 x 2  Scaption 5# x 10 x 2 Body blade shoulder flexion x 1 min x2  Body blade chest level to over head 1 min x2  Manual Therapy: STM Pecs, Lt shoulder mm Skilled palpation to assess response to DN and manual work Production designer, theatre/television/film Dry-Needling  Treatment instructions: Expect mild  to moderate muscle soreness. S/S of pneumothorax if dry needled over a lung field, and to seek immediate medical attention should they occur. Patient verbalized understanding of these instructions and education.  Patient Consent Given: Yes Education handout provided: Previously provided Muscles treated: Lt infraspinatus  Electrical stimulation performed: No Parameters: N/A Treatment response/outcome: decrease palpable tightness; increased mobility/ROM Modalities:  Vaso medium pressure; 34 deg x 15 min    PATIENT EDUCATION: Education details: POC; HEP  Person educated: Patient Education method: Programmer, multimedia, Demonstration, Actor cues, Verbal cues, and Handouts Education comprehension: verbalized understanding, returned demonstration, verbal cues required, tactile cues required, and needs further education  HOME EXERCISE PROGRAM:  Access Code: [email or web address] URL: [email or web address] Date: 04/01/2023 Prepared by: Corlis Leak  Exercises - Circular Shoulder Pendulum with Table Support  - 3-4 x daily - 7 x weekly - 1 sets - 20-30 reps - Seated Scapular Retraction  - 2 x daily - 7 x weekly - 1-2 sets - 10 reps - 10 sec  hold - Seated Cervical Retraction  - 2 x daily - 7 x weekly - 1-2 sets - 5-10 reps - 10 sec  hold - Seated Shoulder External Rotation AAROM with Cane and Hand in Neutral  - 2 x  daily - 7 x weekly - 1 sets - 5-10 reps - 5-10 sec  hold - Standing 'L' Stretch at [employer]  - 2 x daily - 7 x weekly - 1 sets - 5 reps - 10 sec  hold - Supine Shoulder Flexion AAROM with Hands Clasped  - 2 x daily - 7 x weekly - 1 sets - 5-10 reps - 2-3 sec  hold - Supine Shoulder Rhythmic Stabilization- Flexion/Extension  - 2 x daily - 7 x weekly - 1 sets - 10-15 reps - Prone Shoulder Row  - 2 x daily - 7 x weekly - 1-2 sets - 10 reps - 3 sec  hold - Prone Shoulder Extension - Single Arm  - 2 x daily - 7 x weekly - 1 sets - 10-15 reps - 3 sec  hold - Sidelying Shoulder External Rotation  - 2 x daily - 7 x weekly - 1 sets - 10-15 reps - 3 sec  hold - Standing Isometric Shoulder Extension with Doorway - Arm Bent  - 2 x daily - 7 x weekly - 1 sets - 5-10 reps - 5 sec  hold - Isometric Shoulder Abduction at Wall  - 2 x daily - 7 x weekly - 1 sets - 5-10 reps - 5 sec  hold - Isometric Shoulder External Rotation at Wall  - 2 x daily - 7 x weekly - 1 sets - 5 reps - 5 sec  hold - Standing Isometric Shoulder Internal Rotation with Towel Roll at Doorway  - 2 x daily - 7 x weekly - 1 sets - 5 reps - 5 sec  hold - Standing Shoulder Row Reactive Isometric  - 2 x daily - 7 x weekly - 1 sets - 10 reps - 30-45 sec  hold - Shoulder External Rotation Reactive Isometrics  - 1 x daily - 7 x weekly - 1 sets - 5-10 reps - 10-30 sec  hold - Shoulder Internal Rotation Reactive Isometrics  - 2 x daily - 7 x weekly - 1 sets - 10 reps - 3-5 sec  hold - Shoulder External Rotation with Anchored Resistance  - 2 x daily - 7 x weekly - 1-2 sets - 10 reps - 3 sec  hold -  Shoulder Internal Rotation with Resistance  - 2 x daily - 7 x weekly - 2 sets - 10 reps - 3 sec  hold - Doorway Pec Stretch at 60 Degrees Abduction  - 3 x daily - 7 x weekly - 1 sets - 3 reps - Doorway Pec Stretch at 90 Degrees Abduction  - 3 x daily - 7 x weekly - 1 sets - 3 reps - 30 seconds  hold - Doorway Pec Stretch at 120 Degrees Abduction  - 3 x daily - 7  x weekly - 1 sets - 3 reps - 30 second hold  hold - Bicep Stretch at Table  - 2 x daily - 7 x weekly - 1 sets - 3 reps - 20-30 sec  hold - Wall Push Up  - 1 x daily - 7 x weekly - 1-3 sets - 10 reps - 3 sec  hold - Plank on Counter  - 2 x daily - 7 x weekly - 1 sets - 3 reps - 30 sec  hold - Forearm Pronation and Supination with Hammer  - 2 x daily - 7 x weekly - 2-3 sets - 10 reps - 5 sec  hold   ASSESSMENT:  CLINICAL IMPRESSION: Patient returns with some increased intermittent pain in the Lt biceps and anterior shoulder area since the day after last PT visit. He has some decreased end range AROM Lt shoulder but good activation of biceps without pain. He has pain with end range shoulder ER in standing shd 90/elbow 90. Treatment consisted of manual work including DN to Lt biceps and manual work through the biceps and pecs with good release of muscular tightness and decrease in pain with cooresponding increase in AROM. Good response to treatment. Patient encouraged to continue with gentle stretching and strengthening, avoiding overuse of over work of the [employer].   He is s/p Lt shoulder surgery for labral repair and biceps tenodesis 02/12/23.     OBJECTIVE IMPAIRMENTS: poor posture and alignment; limited Lt shoulder ROM, strength and functional activity. He has minimal pain. Sleeping is difficult when out of the recliner. Patient is unable to work or use Lt UE for functional and recreational activities, decreased activity tolerance, decreased mobility, decreased ROM, decreased strength, hypomobility, increased fascial restrictions, impaired flexibility, impaired sensation, impaired UE functional use, improper body mechanics, postural dysfunction, and pain.    GOALS: Goals reviewed with patient? Yes  SHORT TERM GOALS: Target date: 03/28/2023   Independent in initial HEP Baseline: Goal status: met  2.  Wean from sling per MD protocol Baseline:  Goal status: met    LONG TERM  GOALS: Target date: 07/02/2023   Improve posture and alignment with patient demonstrating improved upright posture with posterior shoulder girdle engaged  Baseline:  Goal status: met   2.  AROM Lt shoulder/elbow/forearm =/> than AROM Rt UE  Baseline:  Goal status: met   3.  4/5 to 5/5 strength Lt shoulder and elbow  Baseline:  Goal status: in progress   4.  Return to normal functional activities including return to work with no limitations of Lt UE Baseline:  Goal status: in progress   5.  Independent in HEP including aquatic program as indicated  Baseline:  Goal status: in progress   6.  Improve functional limitation score to 59 Baseline: 4, 53 (04/16/23) Goal status: IN PROGRESS  PLAN:  PT FREQUENCY: 2x/week  PT DURATION: 8 weeks  PLANNED INTERVENTIONS: Therapeutic exercises, Therapeutic activity, Neuromuscular re-education, Patient/Family education, Self  Care, Joint mobilization, Aquatic Therapy, Dry Needling, Electrical stimulation, Spinal mobilization, Cryotherapy, Moist heat, Taping, Vasopneumatic device, Ultrasound, Ionotophoresis 4mg /ml Dexamethasone, Manual therapy, and Re-evaluation  PLAN FOR NEXT SESSION: review and progress exercise; continue with postural correction and education; modalities, manual work, DN as indicated    [employer], PT 05/07/2023, 2:53 PM

## 2023-05-09 ENCOUNTER — Ambulatory Visit: Payer: Managed Care, Other (non HMO) | Admitting: Rehabilitative and Restorative Service Providers"

## 2023-05-09 ENCOUNTER — Encounter: Payer: Self-pay | Admitting: Rehabilitative and Restorative Service Providers"

## 2023-05-09 DIAGNOSIS — M6281 Muscle weakness (generalized): Secondary | ICD-10-CM

## 2023-05-09 DIAGNOSIS — M25512 Pain in left shoulder: Secondary | ICD-10-CM | POA: Diagnosis not present

## 2023-05-09 DIAGNOSIS — R293 Abnormal posture: Secondary | ICD-10-CM

## 2023-05-09 DIAGNOSIS — M25612 Stiffness of left shoulder, not elsewhere classified: Secondary | ICD-10-CM

## 2023-05-09 NOTE — Therapy (Signed)
OUTPATIENT PHYSICAL THERAPY SHOULDER TREATMENT   Patient Name: Richard Long MRN: [id number] DOB:October 01, 1991, 32 y.o., male Today's Date: 05/09/2023  END OF SESSION:  PT End of Session - 05/09/23 1623     Visit Number 15    Number of Visits 24    Date for PT Re-Evaluation 07/02/23    Authorization Type cigna    Authorization Time Period auth required after 5th visit (submitted)    Authorization - Visit Number 15    Authorization - Number of Visits 20    PT Start Time 1530    PT Stop Time 1615    PT Time Calculation (min) 45 min              Past Medical History:  Diagnosis Date   Hypertension    Thyroid disease    Past Surgical History:  Procedure Laterality Date   CYST REMOVAL HAND     Patient Active Problem List   Diagnosis Date Noted   Labral tear of shoulder, left, initial encounter 12/11/2022   Diarrhea 06/19/2022   Well adult exam 02/18/2022   Strain of left soleus muscle 08/11/2021   OSA (obstructive sleep apnea) 02/10/2020   Decreased libido 02/10/2020   Strain of right shoulder 10/20/2019   History of thyroid disorder 10/27/2018   Hypertension goal BP (blood pressure) < 130/80 10/27/2018   Acute pain of right shoulder 10/27/2018   Class 2 severe obesity due to excess calories with serious comorbidity in adult (HCC) 10/27/2018   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   Vitamin D deficiency 10/07/2017   Chronic tension-type headache, not intractable 04/06/2016   Pure hypercholesterolemia 04/06/2016    PCP: Dr [name]   REFERRING PROVIDER: Dr Francena Hanly   REFERRING DIAG: Labral tear Lt shoulder   THERAPY DIAG:  Acute pain of left shoulder  Muscle weakness (generalized)  Abnormal posture  Stiffness of left shoulder, not elsewhere classified  Rationale for Evaluation and Treatment: Rehabilitation  ONSET DATE: 02/12/23  SUBJECTIVE:                                                                                                                                                                                       SUBJECTIVE STATEMENT: Pt reports that Rt shoulder feels much better since the last visit and the following day. He feels he is about back to baseline. He is having no pain in the front of the shoulder, just some tightness with end range ER.    Eval: Patient reports that he has had Lt shoulder pain for the past 6 months with increased pain 1/24. MRI showed labral tear and biceps tendon tear. Underwent Lt  shoulder score 02/12/23 with no post op complications.  Hand dominance: Ambidextrous; primarily Rt  PERTINENT HISTORY: Rt shoulder injury with labral repair and biceps tenodesis 2020; Lt knee injury/damage; anxiety; HTN  PAIN:  Are you having pain? Yes: NPRS scale: 0/10; at worst 1/10 with end range ER   Pain location: biceps  Pain description: dull aching   Aggravating factors: end range ER shoulder at 90/elbow 90; leaning on Lt elbow  Relieving factors: relaxing arm and rolling shoulder back; OTC meds   PRECAUTIONS: no heavy lifting overhead; no jerking movements; do not use Lt shoulder in sitting/rising; no isolated biceps for 8 weeks per MD protocol   WEIGHT BEARING RESTRICTIONS: No  FALLS:  Has patient fallen in last 6 months? 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 Superior posterior labral tear with an associated moderate-sized dissecting paralabral cyst back into the spinoglenoid notch. No findings to suggest compression of the suprascapular nerve. Buford complex variant. Mild rotator cuff tendinopathy/tendinosis with small interstitial tears  mainly involving the infraspinatus and supraspinatus tendons. No partial or full-thickness rotator cuff tear. Intact long head biceps tendon.Os acromiale.    PATIENT SURVEYS:  FOTO 4 target 59 FOTO 53 (04/16/23)  POSTURE: Patient presents with head forward posture with increased thoracic kyphosis; shoulders rounded and elevated; scapulae abducted and rotated along the thoracic spine; head of the humerus anterior in orientation.   UPPER EXTREMITY ROM: AROM Rt shoulder assessed in standing; PROM assessed in supine   Active ROM Rt Passive ROM Lt  Right eval Left eval Left  03/19/23 Left  04/23/23 Left  05/07/23  Shoulder flexion 162 155 173 178 172  Shoulder extension 60 20 64  60  Shoulder abduction(in scapular plane) 162 150 170 176 174  Shoulder adduction       Shoulder internal rotation Thumb T7 UE resting at side  60 in scapular plane  70 scapular plane 70 scapular plane  Shoulder external rotation 119 shoulder 90/elbow 90 degrees  30 degrees scapular plane Elbow flexed  76  In scapular plane elbow flexed  85 scapular plane elbow flexed 85 scapular plane elbow flexed  Elbow flexion WNL  138 WNL   Elbow extension WNL  -3 WNL   Wrist flexion WNL      Wrist extension WNL      Wrist ulnar deviation WNL      Wrist radial deviation WNL      Wrist pronation WNL      Wrist supination WNL      (Blank rows = not tested)  UPPER EXTREMITY MMT:   05/07/23: Manual resistive testing biceps and biceps brachii sub max - in tact no pain with light resistive testing   MMT Right eval Left eval  Shoulder flexion    Shoulder extension    Shoulder abduction    Shoulder adduction    Shoulder internal rotation    Shoulder external rotation    Middle trapezius    Lower trapezius    Elbow flexion    Elbow extension    Wrist flexion    Wrist extension    Wrist ulnar  deviation    Wrist radial deviation    Wrist pronation    Wrist supination    Grip strength (lbs)    (Blank rows = not  tested)  PALPATION:  Muscular tightness Lt pecs; upper trap; shoulder girdle musculature  05/07/23: muscular tightness and banding along the Lt biceps tendon into the biceps musculature; pecs  [facility] Adult PT Treatment:                                                DATE: 05/09/23 Therapeutic Exercise: UBE L4 x 4 min alt every min fwd/bkwd Doorway stretch 30 sec each position Row cable10# x 10 (from lower position #2)  Row cable 10# x 10 Triceps cable machine 10# x 10 Bicep curl 10# x 10  Bottoms up KB carry 5#; 10#  bilat x 375' Body blade shoulder flexion x 1 min x2  Body blade chest level to over head 1 min x2  ER blue TB 10 x 2  IR blue TB 10 x 2  Manual Therapy: STM Pecs, Lt shoulder mm IASTM Lt anterior shoulder through the biceps and pecs  Modalities:  Vaso medium pressure; 34 deg x 15 min   [facility] Adult PT Treatment:                                                DATE: 05/07/23 Therapeutic Exercise: UBE L4  x 4 min alt every min fwd/bkwd AROM Lt shoulder in standing  Gentle biceps stretch standing 20 sec x 2  Manual Therapy: STM Pecs, Lt shoulder mm IASTM Lt anterior shoulder through the biceps and pecs  Skilled palpation to assess response to DN and manual work Production designer, theatre/television/film Dry-Needling  Treatment instructions: Expect mild to moderate muscle soreness. S/S of pneumothorax if dry needled over a lung field, and to seek immediate medical attention should they occur. Patient verbalized understanding of these instructions and education.  Patient Consent Given: Yes Education handout provided: Previously provided Muscles treated: Lt biceps   Electrical stimulation performed: No Parameters: N/A Treatment response/outcome: decrease palpable tightness; increased mobility/ROM Modalities:  Vaso medium pressure; 34 deg x 15 min   [facility] Adult PT Treatment:                                                DATE: 05/02/23 Therapeutic Exercise: UBE L10 X 4 min alt every min  fwd/bwd BFR 190 mmhg UOP,  50% LOP = 95 mmHg Bicep curl x30, 15, 15, 15 with 5# LOP = 100 mmHg Hammer curl x30, 15, 15, 15 with 5# LOP = 100 mmHg tricep cable machine 5# x30, 15, 15, 15 LOP = 100 mmHg overhead tricep 5# x30, 15, 15, 15 LOP = 100 mmHg reactive iso shoulder ER 5# x30, 15, 15, 15 LOP = 100 mmHg reactive iso shoulder IR 5# x30, 15, 15, 15   [facility] Adult PT Treatment:  DATE: 04/30/23 Therapeutic Exercise: UBE L10 x 4 min alt every min fwd/bkwd Doorway stretch 30 sec each position Row cable10# x 20 (from lower position #2)  Row cable 10# x 20 Triceps cable machine 10# x 20 Bicep curl 10# x 10 x 2  Hammer curl 10# x 15 x 1; 5 x 1  Supination/pronation red TB on stair railing 10 x 2  Rocker board on extended arms elevated surface 1.5 min x 2  Farmers carry 10# bilat x 300' Bottoms up KB carry 5# bilat x 375' Shoulder flexion 5# x 10 x 2  Scaption 5# x 10 x 2 Body blade shoulder flexion x 1 min x2  Body blade chest level to over head 1 min x2  Manual Therapy: STM Pecs, Lt shoulder mm Skilled palpation to assess response to DN and manual work Production designer, theatre/television/film Dry-Needling  Treatment instructions: Expect mild to moderate muscle soreness. S/S of pneumothorax if dry needled over a lung field, and to seek immediate medical attention should they occur. Patient verbalized understanding of these instructions and education.  Patient Consent Given: Yes Education handout provided: Previously provided Muscles treated: Lt infraspinatus  Electrical stimulation performed: No Parameters: N/A Treatment response/outcome: decrease palpable tightness; increased mobility/ROM Modalities:  Vaso medium pressure; 34 deg x 15 min    PATIENT EDUCATION: Education details: POC; HEP  Person educated: Patient Education method: Programmer, multimedia, Demonstration, Actor cues, Verbal cues, and Handouts Education comprehension: verbalized  understanding, returned demonstration, verbal cues required, tactile cues required, and needs further education  HOME EXERCISE PROGRAM:  Access Code: [email or web address] URL: [email or web address] Date: 04/01/2023 Prepared by: Corlis Leak  Exercises - Circular Shoulder Pendulum with Table Support  - 3-4 x daily - 7 x weekly - 1 sets - 20-30 reps - Seated Scapular Retraction  - 2 x daily - 7 x weekly - 1-2 sets - 10 reps - 10 sec  hold - Seated Cervical Retraction  - 2 x daily - 7 x weekly - 1-2 sets - 5-10 reps - 10 sec  hold - Seated Shoulder External Rotation AAROM with Cane and Hand in Neutral  - 2 x daily - 7 x weekly - 1 sets - 5-10 reps - 5-10 sec  hold - Standing 'L' Stretch at [employer]  - 2 x daily - 7 x weekly - 1 sets - 5 reps - 10 sec  hold - Supine Shoulder Flexion AAROM with Hands Clasped  - 2 x daily - 7 x weekly - 1 sets - 5-10 reps - 2-3 sec  hold - Supine Shoulder Rhythmic Stabilization- Flexion/Extension  - 2 x daily - 7 x weekly - 1 sets - 10-15 reps - Prone Shoulder Row  - 2 x daily - 7 x weekly - 1-2 sets - 10 reps - 3 sec  hold - Prone Shoulder Extension - Single Arm  - 2 x daily - 7 x weekly - 1 sets - 10-15 reps - 3 sec  hold - Sidelying Shoulder External Rotation  - 2 x daily - 7 x weekly - 1 sets - 10-15 reps - 3 sec  hold - Standing Isometric Shoulder Extension with Doorway - Arm Bent  - 2 x daily - 7 x weekly - 1 sets - 5-10 reps - 5 sec  hold - Isometric Shoulder Abduction at Wall  - 2 x daily - 7 x weekly - 1 sets - 5-10 reps - 5 sec  hold - Isometric Shoulder External Rotation at Wall  -  2 x daily - 7 x weekly - 1 sets - 5 reps - 5 sec  hold - Standing Isometric Shoulder Internal Rotation with Towel Roll at Doorway  - 2 x daily - 7 x weekly - 1 sets - 5 reps - 5 sec  hold - Standing Shoulder Row Reactive Isometric  - 2 x daily - 7 x weekly - 1 sets - 10 reps - 30-45 sec  hold - Shoulder External Rotation Reactive Isometrics  - 1 x daily - 7 x weekly - 1 sets -  5-10 reps - 10-30 sec  hold - Shoulder Internal Rotation Reactive Isometrics  - 2 x daily - 7 x weekly - 1 sets - 10 reps - 3-5 sec  hold - Shoulder External Rotation with Anchored Resistance  - 2 x daily - 7 x weekly - 1-2 sets - 10 reps - 3 sec  hold - Shoulder Internal Rotation with Resistance  - 2 x daily - 7 x weekly - 2 sets - 10 reps - 3 sec  hold - Doorway Pec Stretch at 60 Degrees Abduction  - 3 x daily - 7 x weekly - 1 sets - 3 reps - Doorway Pec Stretch at 90 Degrees Abduction  - 3 x daily - 7 x weekly - 1 sets - 3 reps - 30 seconds  hold - Doorway Pec Stretch at 120 Degrees Abduction  - 3 x daily - 7 x weekly - 1 sets - 3 reps - 30 second hold  hold - Bicep Stretch at Table  - 2 x daily - 7 x weekly - 1 sets - 3 reps - 20-30 sec  hold - Wall Push Up  - 1 x daily - 7 x weekly - 1-3 sets - 10 reps - 3 sec  hold - Plank on Counter  - 2 x daily - 7 x weekly - 1 sets - 3 reps - 30 sec  hold - Forearm Pronation and Supination with Hammer  - 2 x daily - 7 x weekly - 2-3 sets - 10 reps - 5 sec  hold   ASSESSMENT:  CLINICAL IMPRESSION: Patient reports significant improvement in Lt shoulder pain in the past two days. He feels he is back to baseline. Continued with strengthening decreasing reps and weights of exercises. He has no pain in the Lt biceps and anterior shoulder area following last PT visit. He has good increase in end range AROM Lt shoulder without pain. Good response to treatment. Patient encouraged to continue with gentle stretching and strengthening, avoiding overuse of over work of the [employer].   He is s/p Lt shoulder surgery for labral repair and biceps tenodesis 02/12/23.     OBJECTIVE IMPAIRMENTS: poor posture and alignment; limited Lt shoulder ROM, strength and functional activity. He has minimal pain. Sleeping is difficult when out of the recliner. Patient is unable to work or use Lt UE for functional and recreational activities, decreased activity tolerance,  decreased mobility, decreased ROM, decreased strength, hypomobility, increased fascial restrictions, impaired flexibility, impaired sensation, impaired UE functional use, improper body mechanics, postural dysfunction, and pain.    GOALS: Goals reviewed with patient? Yes  SHORT TERM GOALS: Target date: 03/28/2023   Independent in initial HEP Baseline: Goal status: met  2.  Wean from sling per MD protocol Baseline:  Goal status: met    LONG TERM GOALS: Target date: 07/02/2023   Improve posture and alignment with patient demonstrating improved upright posture with posterior shoulder girdle engaged  Baseline:  Goal status: met   2.  AROM Lt shoulder/elbow/forearm =/> than AROM Rt UE  Baseline:  Goal status: met   3.  4/5 to 5/5 strength Lt shoulder and elbow  Baseline:  Goal status: in progress   4.  Return to normal functional activities including return to work with no limitations of Lt UE Baseline:  Goal status: in progress   5.  Independent in HEP including aquatic program as indicated  Baseline:  Goal status: in progress   6.  Improve functional limitation score to 59 Baseline: 4, 53 (04/16/23) Goal status: IN PROGRESS  PLAN:  PT FREQUENCY: 2x/week  PT DURATION: 8 weeks  PLANNED INTERVENTIONS: Therapeutic exercises, Therapeutic activity, Neuromuscular re-education, Patient/Family education, Self Care, Joint mobilization, Aquatic Therapy, Dry Needling, Electrical stimulation, Spinal mobilization, Cryotherapy, Moist heat, Taping, Vasopneumatic device, Ultrasound, Ionotophoresis 4mg /ml Dexamethasone, Manual therapy, and Re-evaluation  PLAN FOR NEXT SESSION: review and progress exercise; continue with postural correction and education; modalities, manual work, DN as indicated    [employer], PT 05/09/2023, 4:24 PM

## 2023-05-13 NOTE — Therapy (Signed)
OUTPATIENT PHYSICAL THERAPY SHOULDER TREATMENT   Patient Name: Richard Long MRN: [id number] DOB:July 08, 1991, 32 y.o., male Today's Date: 05/14/2023  END OF SESSION:  PT End of Session - 05/14/23 1316     Visit Number 16    Number of Visits 24    Date for PT Re-Evaluation 07/02/23    Authorization Type cigna    Authorization Time Period auth required after 5th visit (submitted)    Authorization - Visit Number 16    Authorization - Number of Visits 20    PT Start Time 1317    PT Stop Time 1357    PT Time Calculation (min) 40 min    Activity Tolerance Patient tolerated treatment well    Behavior During Therapy WFL for tasks assessed/performed               Past Medical History:  Diagnosis Date   Hypertension    Thyroid disease    Past Surgical History:  Procedure Laterality Date   CYST REMOVAL HAND     Patient Active Problem List   Diagnosis Date Noted   Labral tear of shoulder, left, initial encounter 12/11/2022   Diarrhea 06/19/2022   Well adult exam 02/18/2022   Strain of left soleus muscle 08/11/2021   OSA (obstructive sleep apnea) 02/10/2020   Decreased libido 02/10/2020   Strain of right shoulder 10/20/2019   History of thyroid disorder 10/27/2018   Hypertension goal BP (blood pressure) < 130/80 10/27/2018   Acute pain of right shoulder 10/27/2018   Class 2 severe obesity due to excess calories with serious comorbidity in adult (HCC) 10/27/2018   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   Vitamin D deficiency 10/07/2017   Chronic tension-type headache, not intractable 04/06/2016   Pure hypercholesterolemia 04/06/2016    PCP: Dr [name]   REFERRING PROVIDER: Dr Francena Hanly   REFERRING DIAG: Labral tear Lt shoulder   THERAPY DIAG:  Acute pain of left shoulder  Muscle weakness (generalized)  Abnormal posture  Stiffness of left shoulder, not elsewhere classified  Rationale for Evaluation and Treatment: Rehabilitation  ONSET DATE:  02/12/23  SUBJECTIVE:                                                                                                                                                                                      SUBJECTIVE STATEMENT: No new complaints. Shoulder is doing well. Still have end range pain.   Eval: Patient reports that he has had Lt shoulder pain for the past 6 months with increased pain 1/24. MRI showed labral tear and biceps tendon tear. Underwent Lt shoulder score 02/12/23 with no post op complications.  Hand dominance:  Ambidextrous; primarily Rt  PERTINENT HISTORY: Rt shoulder injury with labral repair and biceps tenodesis 2020; Lt knee injury/damage; anxiety; HTN  PAIN:  Are you having pain? Yes: NPRS scale: 0/10; at worst 1/10 with end range ER   Pain location: biceps  Pain description: dull aching   Aggravating factors: end range ER shoulder at 90/elbow 90; leaning on Lt elbow  Relieving factors: relaxing arm and rolling shoulder back; OTC meds   PRECAUTIONS: no heavy lifting overhead; no jerking movements; do not use Lt shoulder in sitting/rising; no isolated biceps for 8 weeks per MD protocol   WEIGHT BEARING RESTRICTIONS: No  FALLS:  Has patient fallen in last 6 months? 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 Superior posterior labral tear with an associated moderate-sized dissecting paralabral cyst back into the spinoglenoid notch. No findings to suggest compression of the suprascapular nerve. Buford complex variant. Mild rotator cuff tendinopathy/tendinosis with small interstitial tears mainly involving the  infraspinatus and supraspinatus tendons. No partial or full-thickness rotator cuff tear. Intact long head biceps tendon.Os acromiale.    PATIENT SURVEYS:  FOTO 4 target 59 FOTO 53 (04/16/23)  POSTURE: Patient presents with head forward posture with increased thoracic kyphosis; shoulders rounded and elevated; scapulae abducted and rotated along the thoracic spine; head of the humerus anterior in orientation.   UPPER EXTREMITY ROM: AROM Rt shoulder assessed in standing; PROM assessed in supine   Active ROM Rt Passive ROM Lt  Right eval Left eval Left  03/19/23 Left  04/23/23 Left  05/07/23  Shoulder flexion 162 155 173 178 172  Shoulder extension 60 20 64  60  Shoulder abduction(in scapular plane) 162 150 170 176 174  Shoulder adduction       Shoulder internal rotation Thumb T7 UE resting at side  60 in scapular plane  70 scapular plane 70 scapular plane  Shoulder external rotation 119 shoulder 90/elbow 90 degrees  30 degrees scapular plane Elbow flexed  76  In scapular plane elbow flexed  85 scapular plane elbow flexed 85 scapular plane elbow flexed  Elbow flexion WNL  138 WNL   Elbow extension WNL  -3 WNL   Wrist flexion WNL      Wrist extension WNL      Wrist ulnar deviation WNL      Wrist radial deviation WNL      Wrist pronation WNL      Wrist supination WNL      (Blank rows = not tested)  UPPER EXTREMITY MMT:   05/07/23: Manual resistive testing biceps and biceps brachii sub max - in tact no pain with light resistive testing   MMT Right eval Left eval  Shoulder flexion    Shoulder extension    Shoulder abduction    Shoulder adduction    Shoulder internal rotation    Shoulder external rotation    Middle trapezius    Lower trapezius    Elbow flexion    Elbow extension    Wrist flexion    Wrist extension    Wrist ulnar deviation    Wrist radial deviation    Wrist  pronation    Wrist supination    Grip strength (lbs)    (Blank rows = not tested)  PALPATION:   Muscular tightness Lt pecs; upper trap; shoulder girdle musculature  05/07/23: muscular tightness and banding along the Lt biceps tendon into the biceps musculature; pecs   [facility] Adult PT Treatment:                                                DATE: 05/14/23 Therapeutic Exercise: UBE L4 x 4 min alt every min fwd/bkwd Doorway stretch 30 sec each position Row cable10# x 20 (from lower position #2)  Row cable 10# x 20 Triceps cable machine 10# x 20 Bicep curl 10# x 20  Bottoms up KB carry 5#; 10#  bilat x 375' Body blade shoulder flexion x 1 min x2  Body blade chest level to over head 1 min x2  10# wt ABD with elbows at 90 + ER/IR x 10 B ER blue TB 10 x 2  at 90 deg Counter push up x 10 Manual Therapy: STM Pecs - WNL today IASTM Lt  posterior shoulder    [facility] Adult PT Treatment:                                                DATE: 05/09/23 Therapeutic Exercise: UBE L4 x 4 min alt every min fwd/bkwd Doorway stretch 30 sec each position Row cable10# x 10 (from lower position #2)  Row cable 10# x 10 Triceps cable machine 10# x 10 Bicep curl 10# x 10  Bottoms up KB carry 5#; 10#  bilat x 375' Body blade shoulder flexion x 1 min x2  Body blade chest level to over head 1 min x2  ER blue TB 10 x 2  IR blue TB 10 x 2  Manual Therapy: STM Pecs, Lt shoulder mm IASTM Lt anterior shoulder through the biceps and pecs  Modalities:  Vaso medium pressure; 34 deg x 15 min   [facility] Adult PT Treatment:                                                DATE: 05/07/23 Therapeutic Exercise: UBE L4  x 4 min alt every min fwd/bkwd AROM Lt shoulder in standing  Gentle biceps stretch standing 20 sec x 2  Manual Therapy: STM Pecs, Lt shoulder mm IASTM Lt anterior shoulder through the biceps and pecs  Skilled palpation to assess response to DN and manual work Production designer, theatre/television/film Dry-Needling  Treatment instructions: Expect mild to moderate muscle soreness. S/S of pneumothorax if dry needled over a  lung field, and to seek immediate medical attention should they occur. Patient verbalized understanding of these instructions and education.  Patient Consent Given: Yes Education handout provided: Previously provided Muscles treated: Lt biceps   Electrical stimulation performed: No Parameters: N/A Treatment response/outcome: decrease palpable tightness; increased mobility/ROM Modalities:  Vaso medium pressure; 34 deg x 15 min   PATIENT EDUCATION: Education details: POC; HEP  Person educated: Patient Education method: Explanation, Demonstration, Actor cues, Verbal cues, and Handouts Education  comprehension: verbalized understanding, returned demonstration, verbal cues required, tactile cues required, and needs further education  HOME EXERCISE PROGRAM:  Access Code: [email or web address] URL: [email or web address] Date: 04/01/2023 Prepared by: Corlis Leak  Exercises - Circular Shoulder Pendulum with Table Support  - 3-4 x daily - 7 x weekly - 1 sets - 20-30 reps - Seated Scapular Retraction  - 2 x daily - 7 x weekly - 1-2 sets - 10 reps - 10 sec  hold - Seated Cervical Retraction  - 2 x daily - 7 x weekly - 1-2 sets - 5-10 reps - 10 sec  hold - Seated Shoulder External Rotation AAROM with Cane and Hand in Neutral  - 2 x daily - 7 x weekly - 1 sets - 5-10 reps - 5-10 sec  hold - Standing 'L' Stretch at [employer]  - 2 x daily - 7 x weekly - 1 sets - 5 reps - 10 sec  hold - Supine Shoulder Flexion AAROM with Hands Clasped  - 2 x daily - 7 x weekly - 1 sets - 5-10 reps - 2-3 sec  hold - Supine Shoulder Rhythmic Stabilization- Flexion/Extension  - 2 x daily - 7 x weekly - 1 sets - 10-15 reps - Prone Shoulder Row  - 2 x daily - 7 x weekly - 1-2 sets - 10 reps - 3 sec  hold - Prone Shoulder Extension - Single Arm  - 2 x daily - 7 x weekly - 1 sets - 10-15 reps - 3 sec  hold - Sidelying Shoulder External Rotation  - 2 x daily - 7 x weekly - 1 sets - 10-15 reps - 3 sec  hold - Standing  Isometric Shoulder Extension with Doorway - Arm Bent  - 2 x daily - 7 x weekly - 1 sets - 5-10 reps - 5 sec  hold - Isometric Shoulder Abduction at Wall  - 2 x daily - 7 x weekly - 1 sets - 5-10 reps - 5 sec  hold - Isometric Shoulder External Rotation at Wall  - 2 x daily - 7 x weekly - 1 sets - 5 reps - 5 sec  hold - Standing Isometric Shoulder Internal Rotation with Towel Roll at Doorway  - 2 x daily - 7 x weekly - 1 sets - 5 reps - 5 sec  hold - Standing Shoulder Row Reactive Isometric  - 2 x daily - 7 x weekly - 1 sets - 10 reps - 30-45 sec  hold - Shoulder External Rotation Reactive Isometrics  - 1 x daily - 7 x weekly - 1 sets - 5-10 reps - 10-30 sec  hold - Shoulder Internal Rotation Reactive Isometrics  - 2 x daily - 7 x weekly - 1 sets - 10 reps - 3-5 sec  hold - Shoulder External Rotation with Anchored Resistance  - 2 x daily - 7 x weekly - 1-2 sets - 10 reps - 3 sec  hold - Shoulder Internal Rotation with Resistance  - 2 x daily - 7 x weekly - 2 sets - 10 reps - 3 sec  hold - Doorway Pec Stretch at 60 Degrees Abduction  - 3 x daily - 7 x weekly - 1 sets - 3 reps - Doorway Pec Stretch at 90 Degrees Abduction  - 3 x daily - 7 x weekly - 1 sets - 3 reps - 30 seconds  hold - Doorway Pec Stretch at 120 Degrees Abduction  - 3 x daily - 7 x weekly -  1 sets - 3 reps - 30 second hold  hold - Bicep Stretch at Table  - 2 x daily - 7 x weekly - 1 sets - 3 reps - 20-30 sec  hold - Wall Push Up  - 1 x daily - 7 x weekly - 1-3 sets - 10 reps - 3 sec  hold - Plank on Counter  - 2 x daily - 7 x weekly - 1 sets - 3 reps - 30 sec  hold - Forearm Pronation and Supination with Hammer  - 2 x daily - 7 x weekly - 2-3 sets - 10 reps - 5 sec  hold   ASSESSMENT:  CLINICAL IMPRESSION: Ludwin continues to do well with his shoulder rehab and strengthening with no reports of increased pain with TE. Weight lifting precautions were reviewed with patient today while he was exercising. He still reports some pain in  L post shoulder with manual therapy. Pecs were WNL for tissue tension today. Kieron returns to MD on 05/22/23.   He is s/p Lt shoulder surgery for labral repair and biceps tenodesis 02/12/23.     OBJECTIVE IMPAIRMENTS: poor posture and alignment; limited Lt shoulder ROM, strength and functional activity. He has minimal pain. Sleeping is difficult when out of the recliner. Patient is unable to work or use Lt UE for functional and recreational activities, decreased activity tolerance, decreased mobility, decreased ROM, decreased strength, hypomobility, increased fascial restrictions, impaired flexibility, impaired sensation, impaired UE functional use, improper body mechanics, postural dysfunction, and pain.    GOALS: Goals reviewed with patient? Yes  SHORT TERM GOALS: Target date: 03/28/2023   Independent in initial HEP Baseline: Goal status: met  2.  Wean from sling per MD protocol Baseline:  Goal status: met    LONG TERM GOALS: Target date: 07/02/2023   Improve posture and alignment with patient demonstrating improved upright posture with posterior shoulder girdle engaged  Baseline:  Goal status: met   2.  AROM Lt shoulder/elbow/forearm =/> than AROM Rt UE  Baseline:  Goal status: met   3.  4/5 to 5/5 strength Lt shoulder and elbow  Baseline:  Goal status: in progress   4.  Return to normal functional activities including return to work with no limitations of Lt UE Baseline:  Goal status: in progress   5.  Independent in HEP including aquatic program as indicated  Baseline:  Goal status: in progress   6.  Improve functional limitation score to 59 Baseline: 4, 53 (04/16/23) Goal status: IN PROGRESS  PLAN:  PT FREQUENCY: 2x/week  PT DURATION: 8 weeks  PLANNED INTERVENTIONS: Therapeutic exercises, Therapeutic activity, Neuromuscular re-education, Patient/Family education, Self Care, Joint mobilization, Aquatic Therapy, Dry Needling, Electrical stimulation, Spinal  mobilization, Cryotherapy, Moist heat, Taping, Vasopneumatic device, Ultrasound, Ionotophoresis 4mg /ml Dexamethasone, Manual therapy, and Re-evaluation  PLAN FOR NEXT SESSION: MD note for 05/22/23. review and progress exercise; continue with postural correction and education; modalities, manual work, DN as indicated    [employer], PT  05/14/2023, 3:13 PM

## 2023-05-14 ENCOUNTER — Encounter: Payer: Self-pay | Admitting: Physical Therapy

## 2023-05-14 ENCOUNTER — Ambulatory Visit: Payer: Managed Care, Other (non HMO) | Admitting: Physical Therapy

## 2023-05-14 DIAGNOSIS — M25612 Stiffness of left shoulder, not elsewhere classified: Secondary | ICD-10-CM

## 2023-05-14 DIAGNOSIS — M25512 Pain in left shoulder: Secondary | ICD-10-CM | POA: Diagnosis not present

## 2023-05-14 DIAGNOSIS — M6281 Muscle weakness (generalized): Secondary | ICD-10-CM

## 2023-05-14 DIAGNOSIS — R293 Abnormal posture: Secondary | ICD-10-CM

## 2023-05-21 ENCOUNTER — Ambulatory Visit: Payer: Managed Care, Other (non HMO) | Admitting: Physical Therapy

## 2023-05-21 ENCOUNTER — Encounter: Payer: Self-pay | Admitting: Physical Therapy

## 2023-05-21 DIAGNOSIS — M25512 Pain in left shoulder: Secondary | ICD-10-CM

## 2023-05-21 DIAGNOSIS — R293 Abnormal posture: Secondary | ICD-10-CM

## 2023-05-21 DIAGNOSIS — M6281 Muscle weakness (generalized): Secondary | ICD-10-CM

## 2023-05-21 DIAGNOSIS — M25612 Stiffness of left shoulder, not elsewhere classified: Secondary | ICD-10-CM

## 2023-05-21 NOTE — Therapy (Signed)
OUTPATIENT PHYSICAL THERAPY SHOULDER TREATMENT AND DISCHARGE SUMMARY   PHYSICAL THERAPY DISCHARGE SUMMARY  Visits from Start of Care: 17  Current functional level related to goals / functional outcomes: See progress note for discharge status    Remaining deficits: Needs to continue with strengthening    Education / Equipment: HEP    Patient agrees to discharge. Patient goals were met. Patient is being discharged due to meeting the stated rehab goals. [name] PT, MPH 05/22/23 3:08 PM   Patient Name: Richard Long MRN: [id number] DOB:May 05, 1991, 32 y.o., male Today's Date: 05/21/2023  END OF SESSION:  PT End of Session - 05/21/23 1514     Visit Number 17    Number of Visits 24    Date for PT Re-Evaluation 07/02/23    Authorization Type cigna    Authorization Time Period auth required after 5th visit (submitted)    Authorization - Visit Number 17    Authorization - Number of Visits 20    PT Start Time 1515    PT Stop Time 1600    PT Time Calculation (min) 45 min    Activity Tolerance Patient tolerated treatment well    Behavior During Therapy [facility] for tasks assessed/performed               Past Medical History:  Diagnosis Date   Hypertension    Thyroid disease    Past Surgical History:  Procedure Laterality Date   CYST REMOVAL HAND     Patient Active Problem List   Diagnosis Date Noted   Labral tear of shoulder, left, initial encounter 12/11/2022   Diarrhea 06/19/2022   Well adult exam 02/18/2022   Strain of left soleus muscle 08/11/2021   OSA (obstructive sleep apnea) 02/10/2020   Decreased libido 02/10/2020   Strain of right shoulder 10/20/2019   History of thyroid disorder 10/27/2018   Hypertension goal BP (blood pressure) < 130/80 10/27/2018   Acute pain of right shoulder 10/27/2018   Class 2 severe obesity due to excess calories with serious comorbidity in adult [facility]) 10/27/2018   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   Vitamin D deficiency  10/07/2017   Chronic tension-type headache, not intractable 04/06/2016   Pure hypercholesterolemia 04/06/2016    PCP: Dr [name]   REFERRING PROVIDER: Dr Francena Hanly   REFERRING DIAG: Labral tear Lt shoulder   THERAPY DIAG:  Acute pain of left shoulder  Muscle weakness (generalized)  Abnormal posture  Stiffness of left shoulder, not elsewhere classified  Rationale for Evaluation and Treatment: Rehabilitation  ONSET DATE: 02/12/23  SUBJECTIVE:  SUBJECTIVE STATEMENT: Pt states he went fishing this weekend and his bicep is a little sore. Pt notes his shoulder popped while pulling on a truck bed. Pt states end range pain is nearly all gone.   Eval: Patient reports that he has had Lt shoulder pain for the past 6 months with increased pain 1/24. MRI showed labral tear and biceps tendon tear. Underwent Lt shoulder score 02/12/23 with no post op complications.  Hand dominance: Ambidextrous; primarily Rt  PERTINENT HISTORY: Rt shoulder injury with labral repair and biceps tenodesis 2020; Lt knee injury/damage; anxiety; HTN  PAIN:  Are you having pain? Yes: NPRS scale: 0/10; at worst 1/10 with end range ER   Pain location: biceps  Pain description: dull aching   Aggravating factors: end range ER shoulder at 90/elbow 90; leaning on Lt elbow  Relieving factors: relaxing arm and rolling shoulder back; OTC meds   PRECAUTIONS: no heavy lifting overhead; no jerking movements; do not use Lt shoulder in sitting/rising; no isolated biceps for 8 weeks per MD protocol   WEIGHT BEARING RESTRICTIONS: No  FALLS:  Has patient fallen in last 6 months? 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 Superior posterior labral tear with an associated moderate-sized dissecting paralabral cyst back into the spinoglenoid notch. No findings to suggest compression of the suprascapular nerve. Buford complex variant. Mild rotator cuff tendinopathy/tendinosis with small interstitial tears mainly involving the infraspinatus and supraspinatus tendons. No partial or full-thickness rotator cuff tear. Intact long head biceps tendon.Os acromiale.    PATIENT SURVEYS:  FOTO 4 target 59 FOTO 53 (04/16/23) FOTO 66 (05/21/23)  POSTURE: Patient presents with head forward posture with increased thoracic kyphosis; shoulders rounded and elevated; scapulae abducted and rotated along the thoracic spine; head of the humerus anterior in orientation.   UPPER EXTREMITY ROM: AROM Rt shoulder assessed in standing; PROM assessed in supine   Active ROM Rt Passive ROM Lt  Right eval Left eval Left  03/19/23 Left  04/23/23 Left  05/07/23 Left 05/21/23  Shoulder flexion 162 155 173 178 172 180  Shoulder extension 60 20 64  60 60  Shoulder abduction(in scapular plane) 162 150 170 176 174 178  Shoulder adduction        Shoulder internal rotation Thumb T7 UE resting at side  60 in scapular plane  70 scapular plane 70 scapular plane Thumb T6  Shoulder external rotation 119 shoulder 90/elbow 90 degrees  30 degrees scapular plane Elbow flexed  76  In scapular plane elbow flexed  85 scapular plane elbow flexed 85 scapular plane elbow flexed 95 scapular plane elbow flexed  Elbow flexion WNL  138 WNL    Elbow extension WNL  -3 WNL    Wrist flexion WNL       Wrist extension WNL       Wrist ulnar deviation WNL       Wrist radial deviation WNL       Wrist pronation WNL       Wrist  supination WNL       (Blank rows = not tested)  UPPER EXTREMITY MMT:  05/07/23: Manual resistive testing biceps and biceps brachii sub max - in tact no pain with light resistive testing   MMT Right eval Left eval  Shoulder flexion    Shoulder extension    Shoulder abduction    Shoulder adduction    Shoulder internal rotation    Shoulder external rotation    Middle trapezius    Lower trapezius    Elbow flexion    Elbow extension    Wrist flexion    Wrist extension    Wrist ulnar deviation    Wrist radial deviation    Wrist pronation    Wrist supination    Grip strength (lbs)    (Blank rows = not tested)  PALPATION:  Muscular tightness Lt pecs; upper trap; shoulder girdle musculature  05/07/23: muscular tightness and banding along the Lt biceps tendon into the biceps musculature; pecs  [facility] Adult PT Treatment:                                                DATE: 05/21/23 Therapeutic Exercise: UBE L4 x 4 min alt every min fwd/bkwd Doorway stretch 30 sec each position Row cable 10# 3x10 Bent over row cables 10# 3x10 Flys cables 10# 3x10 Chest press cables 10# 3x10 Tricep cable 10# 3x10 Bicep curl 10# at cables  3x10 Hammer curl 10# at cables x10, x5, x3 Counter plank on bosu alternating L<>R x10 Push up on bosu x 10   [facility] Adult PT Treatment:                                                DATE: 05/14/23 Therapeutic Exercise: UBE L4 x 4 min alt every min fwd/bkwd Doorway stretch 30 sec each position Row cable10# x 20 (from lower position #2)  Row cable 10# x 20 Triceps cable machine 10# x 20 Bicep curl 10# x 20  Bottoms up KB carry 5#; 10#  bilat x 375' Body blade shoulder flexion x 1 min x2  Body blade chest level to over head 1 min x2  10# wt ABD with elbows at 90 + ER/IR x 10 B ER blue TB 10 x 2  at 90 deg Counter push up x 10 Manual Therapy: STM Pecs - WNL today IASTM Lt  posterior shoulder    [facility] Adult PT Treatment:                                                 DATE: 05/09/23 Therapeutic Exercise: UBE L4 x 4 min alt every min fwd/bkwd Doorway stretch 30 sec each position Row cable10# x 10 (from lower position #2)  Row cable 10# x 10 Triceps cable machine 10# x 10 Bicep curl 10# x 10  Bottoms up KB carry 5#; 10#  bilat x 375' Body blade shoulder flexion x 1 min x2  Body blade chest level to over head 1 min x2  ER blue TB 10 x 2  IR blue TB 10 x 2  Manual Therapy: STM Pecs, Lt shoulder mm IASTM Lt anterior shoulder through the biceps and pecs  Modalities:  Vaso medium pressure; 34 deg x 15 min   [facility] Adult PT Treatment:                                                DATE: 05/07/23 Therapeutic Exercise: UBE L4  x 4 min alt every min fwd/bkwd AROM Lt shoulder in standing  Gentle biceps stretch standing 20 sec x 2  Manual Therapy: STM Pecs, Lt shoulder mm IASTM Lt anterior shoulder through the biceps and pecs  Skilled palpation to assess response to DN and manual work Production designer, theatre/television/film Dry-Needling  Treatment instructions: Expect mild to moderate muscle soreness. S/S of pneumothorax if dry needled over a lung field, and to seek immediate medical attention should they occur. Patient verbalized understanding of these instructions and education.  Patient Consent Given: Yes Education handout provided: Previously provided Muscles treated: Lt biceps   Electrical stimulation performed: No Parameters: N/A Treatment response/outcome: decrease palpable tightness; increased mobility/ROM Modalities:  Vaso medium pressure; 34 deg x 15 min   PATIENT EDUCATION: Education details: POC; HEP  Person educated: Patient Education method: Programmer, multimedia, Demonstration, Actor cues, Verbal cues, and Handouts Education comprehension: verbalized understanding, returned demonstration, verbal cues required, tactile cues required, and needs further education  HOME EXERCISE PROGRAM:  Access Code: [email or web address] URL:  [email or web address] Date: 04/01/2023 Prepared by: Corlis Leak  Exercises - Circular Shoulder Pendulum with Table Support  - 3-4 x daily - 7 x weekly - 1 sets - 20-30 reps - Seated Scapular Retraction  - 2 x daily - 7 x weekly - 1-2 sets - 10 reps - 10 sec  hold - Seated Cervical Retraction  - 2 x daily - 7 x weekly - 1-2 sets - 5-10 reps - 10 sec  hold - Seated Shoulder External Rotation AAROM with Cane and Hand in Neutral  - 2 x daily - 7 x weekly - 1 sets - 5-10 reps - 5-10 sec  hold - Standing 'L' Stretch at [employer]  - 2 x daily - 7 x weekly - 1 sets - 5 reps - 10 sec  hold - Supine Shoulder Flexion AAROM with Hands Clasped  - 2 x daily - 7 x weekly - 1 sets - 5-10 reps - 2-3 sec  hold - Supine Shoulder Rhythmic Stabilization- Flexion/Extension  - 2 x daily - 7 x weekly - 1 sets - 10-15 reps - Prone Shoulder Row  - 2 x daily - 7 x weekly - 1-2 sets - 10 reps - 3 sec  hold - Prone Shoulder Extension - Single Arm  - 2 x daily - 7 x weekly - 1 sets - 10-15 reps - 3 sec  hold - Sidelying Shoulder External Rotation  - 2 x daily - 7 x weekly - 1 sets - 10-15 reps - 3 sec  hold - Standing Isometric Shoulder Extension with Doorway - Arm Bent  - 2 x daily - 7 x weekly - 1 sets - 5-10 reps - 5 sec  hold - Isometric Shoulder Abduction at Wall  - 2 x daily - 7 x weekly - 1 sets - 5-10 reps - 5 sec  hold - Isometric Shoulder External Rotation at Wall  - 2 x daily - 7 x weekly - 1 sets - 5 reps - 5 sec  hold - Standing Isometric Shoulder Internal Rotation with  Towel Roll at Doorway  - 2 x daily - 7 x weekly - 1 sets - 5 reps - 5 sec  hold - Standing Shoulder Row Reactive Isometric  - 2 x daily - 7 x weekly - 1 sets - 10 reps - 30-45 sec  hold - Shoulder External Rotation Reactive Isometrics  - 1 x daily - 7 x weekly - 1 sets - 5-10 reps - 10-30 sec  hold - Shoulder Internal Rotation Reactive Isometrics  - 2 x daily - 7 x weekly - 1 sets - 10 reps - 3-5 sec  hold - Shoulder External Rotation  with Anchored Resistance  - 2 x daily - 7 x weekly - 1-2 sets - 10 reps - 3 sec  hold - Shoulder Internal Rotation with Resistance  - 2 x daily - 7 x weekly - 2 sets - 10 reps - 3 sec  hold - Doorway Pec Stretch at 60 Degrees Abduction  - 3 x daily - 7 x weekly - 1 sets - 3 reps - Doorway Pec Stretch at 90 Degrees Abduction  - 3 x daily - 7 x weekly - 1 sets - 3 reps - 30 seconds  hold - Doorway Pec Stretch at 120 Degrees Abduction  - 3 x daily - 7 x weekly - 1 sets - 3 reps - 30 second hold  hold - Bicep Stretch at Table  - 2 x daily - 7 x weekly - 1 sets - 3 reps - 20-30 sec  hold - Wall Push Up  - 1 x daily - 7 x weekly - 1-3 sets - 10 reps - 3 sec  hold - Plank on Counter  - 2 x daily - 7 x weekly - 1 sets - 3 reps - 30 sec  hold - Forearm Pronation and Supination with Hammer  - 2 x daily - 7 x weekly - 2-3 sets - 10 reps - 5 sec  hold   ASSESSMENT:  CLINICAL IMPRESSION: Vibhav continues to do well with his rehab. Mostly limited due to his current lifting precautions to 10 lbs. ROM appears equal to R shoulder -- only slightly less with IR. R UE strength is still not fully equal to L UE. No longer getting as much end range shoulder pain. Awaiting MD clearance to further progress pt for return to work activities.   He is s/p Lt shoulder surgery for labral repair and biceps tenodesis 02/12/23.     OBJECTIVE IMPAIRMENTS: poor posture and alignment; limited Lt shoulder ROM, strength and functional activity. He has minimal pain. Sleeping is difficult when out of the recliner. Patient is unable to work or use Lt UE for functional and recreational activities, decreased activity tolerance, decreased mobility, decreased ROM, decreased strength, hypomobility, increased fascial restrictions, impaired flexibility, impaired sensation, impaired UE functional use, improper body mechanics, postural dysfunction, and pain.    GOALS: Goals reviewed with patient? Yes  SHORT TERM GOALS: Target date:  03/28/2023   Independent in initial HEP Baseline: Goal status: met  2.  Wean from sling per MD protocol Baseline:  Goal status: met    LONG TERM GOALS: Target date: 07/02/2023   Improve posture and alignment with patient demonstrating improved upright posture with posterior shoulder girdle engaged  Baseline:  Goal status: met   2.  AROM Lt shoulder/elbow/forearm =/> than AROM Rt UE  Baseline:  Goal status: met   3.  4/5 to 5/5 strength Lt shoulder and elbow  Baseline:  Goal status:  in progress   4.  Return to normal functional activities including return to work with no limitations of Lt UE Baseline:  Goal status: in progress   5.  Independent in HEP including aquatic program as indicated  Baseline:  Goal status: in progress   6.  Improve functional limitation score to 59 Baseline: 4, 53 (04/16/23) 05/21/23: 66 Goal status: MET  PLAN:  PT FREQUENCY: 2x/week  PT DURATION: 8 weeks  PLANNED INTERVENTIONS: Therapeutic exercises, Therapeutic activity, Neuromuscular re-education, Patient/Family education, Self Care, Joint mobilization, Aquatic Therapy, Dry Needling, Electrical stimulation, Spinal mobilization, Cryotherapy, Moist heat, Taping, Vasopneumatic device, Ultrasound, Ionotophoresis 4mg /ml Dexamethasone, Manual therapy, and Re-evaluation  PLAN FOR NEXT SESSION: Review and progress exercise; continue with postural correction and education; modalities, manual work, DN as indicated    Jaylena Holloway April Ma L [place], PT 05/21/2023, 3:14 PM

## 2023-05-24 ENCOUNTER — Ambulatory Visit (INDEPENDENT_AMBULATORY_CARE_PROVIDER_SITE_OTHER): Payer: Managed Care, Other (non HMO)

## 2023-05-24 ENCOUNTER — Ambulatory Visit: Payer: Managed Care, Other (non HMO) | Admitting: Family Medicine

## 2023-05-24 ENCOUNTER — Encounter: Payer: Self-pay | Admitting: Family Medicine

## 2023-05-24 VITALS — BP 131/85 | HR 83 | Temp 98.0°F | Resp 18 | Ht 65.0 in | Wt 259.5 lb

## 2023-05-24 DIAGNOSIS — J069 Acute upper respiratory infection, unspecified: Secondary | ICD-10-CM | POA: Diagnosis not present

## 2023-05-24 MED ORDER — METHYLPREDNISOLONE 4 MG PO TBPK
ORAL_TABLET | ORAL | 0 refills | Status: DC
Start: 2023-05-24 — End: 2024-01-07

## 2023-05-24 MED ORDER — AZITHROMYCIN 250 MG PO TABS
ORAL_TABLET | ORAL | 0 refills | Status: AC
Start: 2023-05-24 — End: 2023-05-29

## 2023-05-24 NOTE — Progress Notes (Signed)
Acute Office Visit  Subjective:     Patient ID: Richard Long, male    DOB: [birth year], 32 y.o.   MRN: [id number]  Chief Complaint  Patient presents with   Cough    Patient states that he caught a bug from his child for about 3 weeks he's been coughing up mucus and sone blood , he states that he was taking antibiotics and completed the round of antibiotics but the cough has not gone away    Cough Associated symptoms include chest pain and hemoptysis. Pertinent negatives include no shortness of breath or wheezing.  Patient is in today for acute visit.  Pt reports 3 weeks hx of cough. He reports his 61 y.o child was sick first from daycare and now he is sick. No fever.  He reports he's coughed up mucus that's clear for the last few days. He's also had chest pains with coughing along with blood streaked mucus during this time. No body aches. Ran 2 miles yesterday and denied SOB. He reports he got antibiotics and completed on Saturday. Pt is nonsmoker. He does have OSA and wears CPAP at night, no cough at night per wife who is an ER nurse.   Patient Active Problem List   Diagnosis Date Noted   Labral tear of shoulder, left, initial encounter 12/11/2022   Diarrhea 06/19/2022   Well adult exam 02/18/2022   Strain of left soleus muscle 08/11/2021   OSA (obstructive sleep apnea) 02/10/2020   Decreased libido 02/10/2020   Strain of right shoulder 10/20/2019   History of thyroid disorder 10/27/2018   Hypertension goal BP (blood pressure) < 130/80 10/27/2018   Acute pain of right shoulder 10/27/2018   Class 2 severe obesity due to excess calories with serious comorbidity in adult [facility]) 10/27/2018   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   Vitamin D deficiency 10/07/2017   Chronic tension-type headache, not intractable 04/06/2016   Pure hypercholesterolemia 04/06/2016    Review of Systems  Respiratory:  Positive for cough, hemoptysis and sputum production. Negative for shortness of breath and  wheezing.   Cardiovascular:  Positive for chest pain.       Chest pain with coughing   All other systems reviewed and are negative.      Objective:    BP 131/85   Pulse 83   Temp 98 F (36.7 C) (Oral)   Resp 18   Ht 5\' 5"  (1.651 m)   Wt 259 lb 8 oz (117.7 kg)   SpO2 98%   BMI 43.18 kg/m  BP Readings from Last 3 Encounters:  05/24/23 131/85  04/03/23 (!) 127/91  10/24/22 128/75      Physical Exam Vitals and nursing note reviewed.  Constitutional:      Appearance: Normal appearance. He is obese.  HENT:     Head: Normocephalic and atraumatic.     Right Ear: Tympanic membrane, ear canal and external ear normal.     Left Ear: Tympanic membrane, ear canal and external ear normal.     Nose: Nose normal.     Mouth/Throat:     Mouth: Mucous membranes are moist.  Eyes:     Pupils: Pupils are equal, round, and reactive to light.  Cardiovascular:     Rate and Rhythm: Normal rate and regular rhythm.     Pulses: Normal pulses.     Heart sounds: Normal heart sounds.  Pulmonary:     Effort: Pulmonary effort is normal.     Breath sounds: Rales  present.     Comments: Breath sounds diminished  Abdominal:     General: Abdomen is flat.  Skin:    General: Skin is warm.     Capillary Refill: Capillary refill takes less than 2 seconds.  Neurological:     General: No focal deficit present.     Mental Status: He is alert and oriented to person, place, and time. Mental status is at baseline.  Psychiatric:        Mood and Affect: Mood normal.        Behavior: Behavior normal.        Thought Content: Thought content normal.        Judgment: Judgment normal.   No results found for any visits on 05/24/23.      Assessment & Plan:   Problem List Items Addressed This Visit   None Upper respiratory tract infection, unspecified type -     Azithromycin; Take 2 tablets on day 1, then 1 tablet daily on days 2 through 5  Dispense: 6 tablet; Refill: 0 -     methylPREDNISolone; 6-day  pack as directed  Dispense: 21 tablet; Refill: 0 -     DG Chest 2 View; Future   Continued cough x 3 weeks after sick exposure.  Treat with Z pack and medrol dose pack.  Send for CXR due to physical exam and to rule out pneumonia. Advised pt that he may get results over the weekend on mychart before I had a chance to review. If it does show pneumonia, I've already placed him on necessary treatment for this. Will review when we return to the office. Pt voiced understanding and was fine with the plan of care.  No orders of the defined types were placed in this encounter.   No follow-ups on file.  Suzan Slick, MD

## 2023-05-28 ENCOUNTER — Encounter: Payer: Managed Care, Other (non HMO) | Admitting: Rehabilitative and Restorative Service Providers"

## 2023-05-30 ENCOUNTER — Encounter: Payer: Self-pay | Admitting: Family Medicine

## 2023-06-18 ENCOUNTER — Encounter: Payer: Self-pay | Admitting: Family Medicine

## 2023-07-03 ENCOUNTER — Other Ambulatory Visit: Payer: Self-pay

## 2023-07-03 DIAGNOSIS — E7800 Pure hypercholesterolemia, unspecified: Secondary | ICD-10-CM

## 2023-07-03 DIAGNOSIS — R6882 Decreased libido: Secondary | ICD-10-CM

## 2023-07-04 ENCOUNTER — Encounter: Payer: Self-pay | Admitting: Family Medicine

## 2023-07-05 ENCOUNTER — Other Ambulatory Visit: Payer: Self-pay | Admitting: Family Medicine

## 2023-07-05 DIAGNOSIS — R7989 Other specified abnormal findings of blood chemistry: Secondary | ICD-10-CM

## 2023-08-19 ENCOUNTER — Telehealth: Payer: Self-pay | Admitting: General Practice

## 2023-08-19 NOTE — Transitions of Care (Post Inpatient/ED Visit) (Signed)
08/19/2023  Name: Richard Long MRN: [id number] DOB: 1991/09/22  Today's TOC FU Call Status: Today's TOC FU Call Status:: Successful TOC FU Call Completed TOC FU Call Complete Date: 08/19/23 Patient's Name and Date of Birth confirmed.  Transition Care Management Follow-up Telephone Call Date of Discharge: 08/16/23 Discharge Facility: Other [facility]) Name of Other (Non-[facility]) Discharge Facility: [facility] Type of Discharge: Emergency Department Reason for ED Visit: Orthopedic Conditions Orthopedic/Injury Diagnosis:  (knee pain) How have you been since you were released from the hospital?: Better Any questions or concerns?: No  Items Reviewed: Did you receive and understand the discharge instructions provided?: Yes Medications obtained,verified, and reconciled?: Yes (Medications Reviewed) Any new allergies since your discharge?: No Dietary orders reviewed?: NA Do you have support at home?: Yes  Medications Reviewed Today: Medications Reviewed Today     Reviewed by Modesto Charon, RN (Registered Nurse) on 08/19/23 at 1105  Med List Status: <None>   Medication Order Taking? Sig Documenting Provider Last Dose Status Informant  AMBULATORY NON FORMULARY MEDICATION [id number] No Please provide Autopap machine.  Settings 5-20cmH2O.  Please provide mask and headgear per patient preference.  Include all accessories including humidification chamber, filter and heated tubing.  Send compliance data after 90 days. [name], DO Taking Active   amLODipine (NORVASC) 2.5 MG tablet [id number] No Take 1 tablet (2.5 mg total) by mouth daily. [name], DO Taking Active   methylPREDNISolone (MEDROL DOSEPAK) 4 MG TBPK tablet [id number]  6-day pack as directed Suzan Slick, MD  Active   rosuvastatin (CRESTOR) 10 MG tablet [id number] No Take 1 tablet (10 mg total) by mouth daily. [name], DO Taking Active             Home Care and  Equipment/Supplies: Were Home Health Services Ordered?: NA Any new equipment or medical supplies ordered?: NA  Functional Questionnaire: Do you need assistance with bathing/showering or dressing?: No Do you need assistance with meal preparation?: No Do you need assistance with eating?: No Do you have difficulty maintaining continence: No Do you need assistance with getting out of bed/getting out of a chair/moving?: No Do you have difficulty managing or taking your medications?: No  Follow up appointments reviewed: PCP Follow-up appointment confirmed?: NA Specialist Hospital Follow-up appointment confirmed?: NA Do you need transportation to your follow-up appointment?: No Do you understand care options if your condition(s) worsen?: Yes-patient verbalized understanding  SDOH Interventions Today    Flowsheet Row Most Recent Value  SDOH Interventions   Transportation Interventions Intervention Not Indicated       SIGNATURE Modesto Charon, RN BSN Nurse Health Advisor

## 2023-10-07 ENCOUNTER — Ambulatory Visit (INDEPENDENT_AMBULATORY_CARE_PROVIDER_SITE_OTHER): Payer: Managed Care, Other (non HMO) | Admitting: Endocrinology

## 2023-10-07 ENCOUNTER — Encounter: Payer: Self-pay | Admitting: Endocrinology

## 2023-10-07 VITALS — BP 122/80 | HR 75 | Resp 20 | Ht 65.0 in | Wt 263.6 lb

## 2023-10-07 DIAGNOSIS — R7989 Other specified abnormal findings of blood chemistry: Secondary | ICD-10-CM | POA: Diagnosis not present

## 2023-10-07 NOTE — Progress Notes (Signed)
Outpatient Endocrinology Note [place] Richard Arrowood, MD  10/07/23  Patient's Name: Richard Long    DOB: 10-28-1991    MRN: [id number]  REASON OF VISIT: New consult for evaluation of low testosterone.  REFERRING PROVIDER: [name], DO   PCP:  [name], DO  HISTORY OF PRESENT ILLNESS:   Richard Long is a 32 y.o. old male with past medical history listed below, is here for evaluation of concern of low testosterone.  Pertinent Hx: Patient has symptoms of gradual weight gain, unable to lose weight, low libido.  He had testosterone level checked multiple times in the past, total testosterone has been in the low normal range and referred to endocrinology for evaluation and management.  Patient does regular exercise with running, weightlifting, [employer] etc, despite this not able to lose weight.  He had tried phentermine in the past, had seen dietitian and following diet plan.  He does not want to be on GLP-1 receptor and agonist due to concern of potential long-term side effects.    Labs reviewed: Testosterone labs were checked in the mid to late morning.  He had low normal total testosterone, had normal free testosterone and normal bioavailable testosterone except one occasion with mildly low bioavailable testosterone in April 2023.  Total testosterone of 273 in July 2024, with low normal limit up 264 checked around mid morning.  He had low normal sex hormone binding globulin in the past.  He had normal thyroid function test in the past.   Latest Reference Range & Units 02/10/20 09:00 02/06/21 00:00 02/13/21 09:19 03/16/22 10:41 07/03/23 09:48  Sex Horm Binding Glob, Serum 10 - 50 nmol/L   21 25   Testosterone 264 - 916 ng/dL 829 562 130 865 784  Testosterone, Bioavailable 110.0 - 575.0 ng/dL   696.2 952.8 (L)   Testosterone Free 46.0 - 224.0 pg/mL   66.6 54.8   (L): Data is abnormally low   Libido:                          Decreased  Erectile Dysfunction:  Yes Gynecomastia:           No Fathered any child:     Yes, 75 year old son in 2024.  Shaving less often:     No Opioid use:                  No Decreased strength    No Decreased Muscle      No Tiredness/Fatigue       No Anabolic steroids:        No Weight lifting:              No Excessive exercise:    No Fractures:                    No Vision issues               No Sense of Smell           Intact Truama, Radiation, Mumps  No.  No genital or head trauma.  He has obstructive sleep apnea on CPAP.  Interval history 10/07/23 Patient presented to evaluation and management of concern of low testosterone.  REVIEW OF SYSTEMS:  As per history of present illness.   PAST MEDICAL HISTORY: Past Medical History:  Diagnosis Date   Hypertension    Thyroid disease     PAST SURGICAL HISTORY: Past Surgical  History:  Procedure Laterality Date   CYST REMOVAL HAND      ALLERGIES: Allergies  Allergen Reactions   Shellfish Allergy Anaphylaxis and Itching   Shrimp Extract    Tape Rash    Burn    FAMILY HISTORY:  Family History  Problem Relation Age of Onset   Hyperlipidemia Mother    Diabetes Father    Diabetes Maternal Grandmother    Heart attack Maternal Grandfather    Diabetes Maternal Grandfather    Heart attack Paternal Grandfather    Heart disease Paternal Grandfather     SOCIAL HISTORY: Social History   Socioeconomic History   Marital status: Married    Spouse name: Not on file   Number of children: Not on file   Years of education: Not on file   Highest education level: Not on file  Occupational History   Occupation: Emergency planning/management officer  Tobacco Use   Smoking status: Never   Smokeless tobacco: Never  Vaping Use   Vaping status: Never Used  Substance and Sexual Activity   Alcohol use: Yes    Comment: social   Drug use: Never   Sexual activity: Yes    Birth control/protection: None  Other Topics Concern   Not on file  Social History Narrative   Not on  file   Social Determinants of Health   Financial Resource Strain: Not on file  Food Insecurity: Not on file  Transportation Needs: No Transportation Needs (08/19/2023)   PRAPARE - Administrator, Civil Service (Medical): No    Lack of Transportation (Non-Medical): No  Physical Activity: Not on file  Stress: Not on file  Social Connections: Unknown (04/03/2022)   Received from [facility]   Social Network    Social Network: Not on file    MEDICATIONS:  Current Outpatient Medications  Medication Sig Dispense Refill   AMBULATORY NON FORMULARY MEDICATION Please provide Autopap machine.  Settings 5-20cmH2O.  Please provide mask and headgear per patient preference.  Include all accessories including humidification chamber, filter and heated tubing.  Send compliance data after 90 days. 1 Device 0   amLODipine (NORVASC) 2.5 MG tablet Take 1 tablet (2.5 mg total) by mouth daily. 90 tablet 3   rosuvastatin (CRESTOR) 10 MG tablet Take 1 tablet (10 mg total) by mouth daily. 90 tablet 3   methylPREDNISolone (MEDROL DOSEPAK) 4 MG TBPK tablet 6-day pack as directed 21 tablet 0   No current facility-administered medications for this visit.    PHYSICAL EXAM: Vitals:   10/07/23 0942  BP: 122/80  Pulse: 75  Resp: 20  SpO2: 98%  Weight: 263 lb 9.6 oz (119.6 kg)  Height: 5\' 5"  (1.651 m)   Body mass index is 43.87 kg/m.  Wt Readings from Last 3 Encounters:  10/07/23 263 lb 9.6 oz (119.6 kg)  05/24/23 259 lb 8 oz (117.7 kg)  04/03/23 261 lb (118.4 kg)    General: Well developed, well nourished male in no apparent distress. Non-Cushingoid. No obvious Klinefelter's syndrome body habitus HEENT: AT/[place], no external lesions. Hearing intact to the spoken word Eyes: EOMI. No proptosis, stare and lid lag. Conjunctiva clear and no icterus. Visual field grossly intact in confrontation Neck: Trachea midline, neck supple without appreciable thyromegaly or lymphadenopathy and  no palpable thyroid nodules Lungs: Clear to auscultation, no wheeze. Respirations not labored Heart: S1S2, Regular in rate and rhythm.  Abdomen: Soft, non tender, non distended Neurologic: Alert, oriented, normal speech, deep tendon biceps reflexes normal,  no gross focal neurological deficit. No obvious proximal weakness Extremities: No pedal pitting edema, no tremors of outstretched hands, no excessive hair growth Skin: Warm, color good.  Psychiatric: Does not appear depressed or anxious  PERTINENT HISTORIC LABORATORY AND IMAGING STUDIES:  All pertinent laboratory results were reviewed. Please see HPI also for further details.   ASSESSMENT / PLAN  1. Low testosterone    -Patient is referred due to concern of low testosterone.  He had testosterone lab checked multiple times in the past had been in the low normal range in mid to late morning, except 1 episode with mildly low bioavailable testosterone in April 2023.  Most recent total testosterone of 273 with the lower normal limit of 264 in July 2024.  He had low normal sex hormone binding globulin in the past.  He has complaints of unable to lose weight and low libido.  -Discussed that based on the review of laboratory results, and history, he is less likely to have hypogonadism.  Low normal total testosterone could be related to having low normal sex hormone binding globulin which is related to overweight/obesity.  However with the concern of low testosterone and 1 episode of mildly low bioavailable testosterone I would like to complete secondary workup for hypogonadism. -Discussed in detail about weight management with diet and lifestyle modification, losing weight will help to improve sex hormone binding globulin as well.  He will continue to follow with primary care provider for weight management as well.  Plan: -I would like to check total, free, bioavailable testosterone along with gonadotropins LH, FSH. -For secondary workup to check  prolactin, cortisol, iron studies. -I would like to check sex hormone binding globulin as well.  Patient will complete lab in the early morning 7 to 8 AM, fasting.  Possible complications of testosterone supplementation include: Progression of prostate CA/increase in PSA Decreases in HDL Acne Oily skin Testicular atrophy Gynecomastia Polycythemia Sleep apnea  Likely benefits of testosterone supplementation include improvements in: Strength and muscle mass Sense of well-being Sexual activity/ libido Energy  Decreased overall body fat  Diagnoses and all orders for this visit:  Low testosterone -     Sex hormone binding globulin; Future -     Luteinizing hormone; Future -     Prolactin; Future -     Follicle stimulating hormone; Future -     Cortisol; Future -     Iron, TIBC and Ferritin Panel; Future -     Testosterone Total,Free,Bio, Males; Future    DISPOSITION Follow up in clinic in determined based on the above lab results.  All questions answered and patient verbalized understanding of the plan.  [place] Ishaan Villamar, MD [facility] Endocrinology [facility] [address] [place], [address] [place], [place] [address] Phone # [phone]  At least part of this note was generated using voice recognition software. Inadvertent word errors may have occurred, which were not recognized during the proofreading process.

## 2023-11-18 ENCOUNTER — Other Ambulatory Visit: Payer: Self-pay

## 2023-11-18 DIAGNOSIS — I10 Essential (primary) hypertension: Secondary | ICD-10-CM

## 2023-11-18 DIAGNOSIS — R6882 Decreased libido: Secondary | ICD-10-CM

## 2023-11-18 DIAGNOSIS — E7800 Pure hypercholesterolemia, unspecified: Secondary | ICD-10-CM

## 2023-11-18 NOTE — Progress Notes (Signed)
Switched labs to [facility].  

## 2024-01-07 ENCOUNTER — Telehealth (INDEPENDENT_AMBULATORY_CARE_PROVIDER_SITE_OTHER): Payer: Managed Care, Other (non HMO) | Admitting: Family Medicine

## 2024-01-07 ENCOUNTER — Encounter: Payer: Self-pay | Admitting: Family Medicine

## 2024-01-07 DIAGNOSIS — H6690 Otitis media, unspecified, unspecified ear: Secondary | ICD-10-CM | POA: Insufficient documentation

## 2024-01-07 DIAGNOSIS — H66002 Acute suppurative otitis media without spontaneous rupture of ear drum, left ear: Secondary | ICD-10-CM

## 2024-01-07 MED ORDER — PREDNISONE 20 MG PO TABS: 20.0000 mg | ORAL_TABLET | Freq: Two times a day (BID) | ORAL | 0 refills | Status: AC

## 2024-01-07 MED ORDER — AMOXICILLIN-POT CLAVULANATE 875-125 MG PO TABS: 1.0000 | ORAL_TABLET | Freq: Two times a day (BID) | ORAL | 0 refills | Status: AC

## 2024-01-07 NOTE — Assessment & Plan Note (Addendum)
Symptoms concerning for OM and sinusitis.  Treating agressively with course of augmentin and prednisone.  Increased fluids recommended.  He may also conitnue flonase. Red flags reviewed.

## 2024-01-07 NOTE — Progress Notes (Signed)
Pt reports that he is just getting over a cold about 7 days ago. He now has problems with his hearing he stated that everything sounds muffled to him.

## 2024-01-07 NOTE — Progress Notes (Signed)
 Richard Long - 33 y.o. male MRN [id number]  Date of birth: 1991/01/19   This visit type was conducted due to national recommendations for restrictions regarding the COVID-19 Pandemic (e.g. social distancing).  This format is felt to be most appropriate for this patient at this time.  All issues noted in this document were discussed and addressed.  No physical exam was performed (except for noted visual exam findings with Video Visits).  I discussed the limitations of evaluation and management by telemedicine and the availability of in person appointments. The patient expressed understanding and agreed to proceed.  I connected withNAME@ on 01/07/24 at 10:50 AM EST by a video enabled telemedicine application and verified that I am speaking with the correct person using two identifiers.  Present at visit: Velma Ku, DO Bonnie Rosalynn Magnus   Patient Location: Home [address] [place] [address]   Provider location:   [facility]  Chief Complaint  Patient presents with   Sinusitis    HPI  Richard Long is a 33 y.o. male who presents via audio/video conferencing for a telehealth visit today.  He reports having a cold about a week ago.  Now with increased pain and pressure along with some sinus pressure.  He denies fever, chills or drainage from the ear.  He has not had headache.   ROS:  A comprehensive ROS was completed and negative except as noted per HPI  Past Medical History:  Diagnosis Date   Hypertension    Thyroid disease     Past Surgical History:  Procedure Laterality Date   CYST REMOVAL HAND      Family History  Problem Relation Age of Onset   Hyperlipidemia Mother    Diabetes Father    Diabetes Maternal Grandmother    Heart attack Maternal Grandfather    Diabetes Maternal Grandfather    Heart attack Paternal Grandfather    Heart disease Paternal Grandfather     Social History   Socioeconomic History   Marital status: Married     Spouse name: Not on file   Number of children: Not on file   Years of education: Not on file   Highest education level: Not on file  Occupational History   Occupation: Emergency Planning/management Officer  Tobacco Use   Smoking status: Never   Smokeless tobacco: Never  Vaping Use   Vaping status: Never Used  Substance and Sexual Activity   Alcohol use: Yes    Comment: social   Drug use: Never   Sexual activity: Yes    Birth control/protection: None  Other Topics Concern   Not on file  Social History Narrative   Not on file   Social Drivers of Health   Financial Resource Strain: Not on file  Food Insecurity: Not on file  Transportation Needs: No Transportation Needs (08/19/2023)   PRAPARE - Administrator, Civil Service (Medical): No    Lack of Transportation (Non-Medical): No  Physical Activity: Not on file  Stress: Not on file  Social Connections: Unknown (04/03/2022)   Received [facility]   Social Network    Social Network: Not on file  Intimate Partner Violence: Unknown (03/09/2022)   Received from [facility]   HITS    Physically Hurt: Not on file    Insult or Talk Down To: Not on file    Threaten Physical Harm: Not on file    Scream or Curse: Not on file  Current Outpatient Medications:    AMBULATORY NON FORMULARY MEDICATION, Please provide Autopap machine.  Settings 5-20cmH2O.  Please provide mask and headgear per patient preference.  Include all accessories including humidification chamber, filter and heated tubing.  Send compliance data after 90 days., Disp: 1 Device, Rfl: 0   amLODipine  (NORVASC ) 2.5 MG tablet, Take 1 tablet (2.5 mg total) by mouth daily., Disp: 90 tablet, Rfl: 3   amoxicillin -clavulanate (AUGMENTIN ) 875-125 MG tablet, Take 1 tablet by mouth 2 (two) times daily., Disp: 20 tablet, Rfl: 0   predniSONE  (DELTASONE ) 20 MG tablet, Take 1 tablet (20 mg total) by mouth 2 (two) times daily with a meal for 5 days., Disp:  10 tablet, Rfl: 0   rosuvastatin  (CRESTOR ) 10 MG tablet, Take 1 tablet (10 mg total) by mouth daily., Disp: 90 tablet, Rfl: 3  EXAM:  VITALS per patient if applicable: There were no vitals taken for this visit.  GENERAL: alert, oriented, appears well and in no acute distress  HEENT: atraumatic, conjunttiva clear, no obvious abnormalities on inspection of external nose and ears  NECK: normal movements of the head and neck  LUNGS: on inspection no signs of respiratory distress, breathing rate appears normal, no obvious gross SOB, gasping or wheezing  CV: no obvious cyanosis  MS: moves all visible extremities without noticeable abnormality  PSYCH/NEURO: pleasant and cooperative, no obvious depression or anxiety, speech and thought processing grossly intact  ASSESSMENT AND PLAN:  Discussed the following assessment and plan:  Acute otitis media Symptoms concerning for OM and sinusitis.  Treating agressively with course of augmentin  and prednisone .  Increased fluids recommended.  He may also conitnue flonase . Red flags reviewed.     I discussed the assessment and treatment plan with the patient. The patient was provided an opportunity to ask questions and all were answered. The patient agreed with the plan and demonstrated an understanding of the instructions.   The patient was advised to call back or seek an in-person evaluation if the symptoms worsen or if the condition fails to improve as anticipated.    Velma Ku, DO

## 2024-01-09 ENCOUNTER — Telehealth: Payer: Managed Care, Other (non HMO) | Admitting: Family Medicine

## 2024-02-23 ENCOUNTER — Telehealth: Admitting: Family

## 2024-02-23 DIAGNOSIS — J101 Influenza due to other identified influenza virus with other respiratory manifestations: Secondary | ICD-10-CM

## 2024-02-23 MED ORDER — OSELTAMIVIR PHOSPHATE 75 MG PO CAPS: 75.0000 mg | ORAL_CAPSULE | Freq: Two times a day (BID) | ORAL | 0 refills | Status: AC

## 2024-02-23 NOTE — Progress Notes (Signed)
 E visit for Flu like symptoms   We are sorry that you are not feeling well.  Here is how we plan to help! Based on what you have shared with me it looks like you may have a respiratory virus that may be influenza.  Influenza or "the flu" is   an infection caused by a respiratory virus. The flu virus is highly contagious and persons who did not receive their yearly flu vaccination may "catch" the flu from close contact.  We have anti-viral medications to treat the viruses that cause this infection. They are not a "cure" and only shorten the course of the infection. These prescriptions are most effective when they are given within the first 2 days of "flu" symptoms. Antiviral medication are indicated if you have a high risk of complications from the flu. You should  also consider an antiviral medication if you are in close contact with someone who is at risk. These medications can help patients avoid complications from the flu  but have side effects that you should know. Possible side effects from Tamiflu or oseltamivir include nausea, vomiting, diarrhea, dizziness, headaches, eye redness, sleep problems or other respiratory symptoms. You should not take Tamiflu if you have an allergy to oseltamivir or any to the ingredients in Tamiflu.  Based upon your symptoms and potential risk factors I have prescribed Oseltamivir (Tamiflu).  It has been sent to your designated pharmacy.  You will take one 75 mg capsule orally twice a day for the next 5 days.   I have sent a work note to Pharmacologist.   ANYONE WHO HAS FLU SYMPTOMS SHOULD: Stay home. The flu is highly contagious and going out or to work exposes others! Be sure to drink plenty of fluids. Water is fine as well as fruit juices, sodas and electrolyte beverages. You may want to stay away from caffeine or alcohol. If you are nauseated, try taking small sips of liquids. How do you know if you are getting enough fluid? Your urine should be a pale yellow or  almost colorless. Get rest. Taking a steamy shower or using a humidifier may help nasal congestion and ease sore throat pain. Using a saline nasal spray works much the same way. Cough drops, hard candies and sore throat lozenges may ease your cough. Line up a caregiver. Have someone check on you regularly.   GET HELP RIGHT AWAY IF: You cannot keep down liquids or your medications. You become short of breath Your fell like you are going to pass out or loose consciousness. Your symptoms persist after you have completed your treatment plan MAKE SURE YOU  Understand these instructions. Will watch your condition. Will get help right away if you are not doing well or get worse.  Your e-visit answers were reviewed by a board certified advanced clinical practitioner to complete your personal care plan.  Depending on the condition, your plan could have included both over the counter or prescription medications.  If there is a problem please reply  once you have received a response from your provider.  Your safety is important to [place].  If you have drug allergies check your prescription carefully.    You can use MyChart to ask questions about today's visit, request a non-urgent call back, or ask for a work or school excuse for 24 hours related to this e-Visit. If it has been greater than 24 hours you will need to follow up with your provider, or enter a new e-Visit to  address those concerns.  You will get an e-mail in the next two days asking about your experience.  I hope that your e-visit has been valuable and will speed your recovery. Thank you for using e-visits.   Approximately 5 minutes was spent documenting and reviewing patient's chart.

## 2024-04-27 ENCOUNTER — Other Ambulatory Visit: Payer: Self-pay | Admitting: Family Medicine

## 2024-08-04 ENCOUNTER — Encounter: Payer: Self-pay | Admitting: Sports Medicine

## 2024-08-18 ENCOUNTER — Telehealth: Payer: Self-pay | Admitting: Family Medicine

## 2024-08-18 NOTE — Telephone Encounter (Signed)
 Copied from CRM [id number]. Topic: Medical Record Request - Records Request >> Aug 18, 2024  9:35 AM Laurier C wrote: Reason for CRM:  Patient is requesting TB test results and would like to have them uploaded via My Chart. Patient will be starting a new job. Patient can be called back at [phone]

## 2024-08-18 NOTE — Telephone Encounter (Signed)
 Spoke with patient. 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  He was informed that we do not have a TB skin test showing in his chart . He will call back to initiate this if needed in future

## 2024-11-13 ENCOUNTER — Telehealth: Payer: Self-pay

## 2024-11-13 NOTE — Telephone Encounter (Signed)
 Informed [employer] that Richard Long was the original prescriber of the CPAP machine however the patient has not been seen in this office in over a year. [employer] states that they will contact the patient to let them know that an OV Is required with Dr. Ku for continued CPAP services through their company.

## 2024-11-13 NOTE — Telephone Encounter (Signed)
 Copied from CRM #[id number]. Topic: General - Other >> Nov 13, 2024 10:33 AM Shanda MATSU wrote: Reason for CRM: Macario w/[employer] [facility] [phone] called inw anting to confirm if patient is a patient of Dr. Alvia and when patient had last appt with Dr. Alvia, adv caller that PCP for patient is Dr. Alvia and that last visit with Dr. Alvia was on 01/07/2024. Caller then wanted to know if Dr. Alvia is providing care for patient's CPAP machine, caller is req a call back to confirm this info.

## 2024-12-18 ENCOUNTER — Other Ambulatory Visit: Payer: Self-pay | Admitting: Family Medicine

## 2024-12-18 ENCOUNTER — Encounter: Payer: Self-pay | Admitting: Family Medicine

## 2024-12-18 NOTE — Telephone Encounter (Signed)
 Pls contact pt to schedule HTN appt with Dr. Alvia and fasting labs. No refills available.

## 2025-01-01 ENCOUNTER — Ambulatory Visit
